# Patient Record
Sex: Female | Born: 1950 | Race: White | Hispanic: No | Marital: Married | Smoking: Never smoker
Health system: Southern US, Community
[De-identification: ages and names within clinical notes are randomized; demographics above are authoritative.]

## PROBLEM LIST (undated history)

## (undated) DIAGNOSIS — M810 Age-related osteoporosis without current pathological fracture: Secondary | ICD-10-CM

## (undated) DIAGNOSIS — R51 Headache: Secondary | ICD-10-CM

## (undated) DIAGNOSIS — M5000 Cervical disc disorder with myelopathy, unspecified cervical region: Secondary | ICD-10-CM

## (undated) DIAGNOSIS — K219 Gastro-esophageal reflux disease without esophagitis: Secondary | ICD-10-CM

## (undated) DIAGNOSIS — R55 Syncope and collapse: Secondary | ICD-10-CM

## (undated) DIAGNOSIS — Z86010 Personal history of colonic polyps: Secondary | ICD-10-CM

## (undated) DIAGNOSIS — M949 Disorder of cartilage, unspecified: Secondary | ICD-10-CM

## (undated) DIAGNOSIS — J45909 Unspecified asthma, uncomplicated: Secondary | ICD-10-CM

## (undated) DIAGNOSIS — R609 Edema, unspecified: Secondary | ICD-10-CM

## (undated) DIAGNOSIS — K5790 Diverticulosis of intestine, part unspecified, without perforation or abscess without bleeding: Secondary | ICD-10-CM

## (undated) DIAGNOSIS — M899 Disorder of bone, unspecified: Secondary | ICD-10-CM

## (undated) DIAGNOSIS — E785 Hyperlipidemia, unspecified: Secondary | ICD-10-CM

## (undated) HISTORY — PX: APPENDECTOMY: SHX54

## (undated) HISTORY — DX: Hyperlipidemia, unspecified: E78.5

## (undated) HISTORY — DX: Disorder of cartilage, unspecified: M94.9

## (undated) HISTORY — PX: OTHER SURGICAL HISTORY: SHX169

## (undated) HISTORY — PX: ABDOMINAL HYSTERECTOMY: SHX81

## (undated) HISTORY — DX: Diverticulosis of intestine, part unspecified, without perforation or abscess without bleeding: K57.90

## (undated) HISTORY — PX: TUBAL LIGATION: SHX77

## (undated) HISTORY — DX: Age-related osteoporosis without current pathological fracture: M81.0

## (undated) HISTORY — DX: Edema, unspecified: R60.9

## (undated) HISTORY — DX: Headache: R51

## (undated) HISTORY — DX: Personal history of colonic polyps: Z86.010

## (undated) HISTORY — DX: Syncope and collapse: R55

## (undated) HISTORY — DX: Disorder of bone, unspecified: M89.9

## (undated) HISTORY — PX: TONSILLECTOMY: SHX5217

## (undated) HISTORY — DX: Unspecified asthma, uncomplicated: J45.909

## (undated) HISTORY — DX: Gastro-esophageal reflux disease without esophagitis: K21.9

## (undated) HISTORY — PX: LUMBAR DISC SURGERY: SHX700

## (undated) HISTORY — DX: Cervical disc disorder with myelopathy, unspecified cervical region: M50.00

## (undated) HISTORY — PX: CHOLECYSTECTOMY: SHX55

---

## 1996-12-08 ENCOUNTER — Encounter: Payer: Self-pay | Admitting: Internal Medicine

## 1996-12-08 LAB — CONVERTED CEMR LAB

## 1999-07-19 ENCOUNTER — Other Ambulatory Visit: Admission: RE | Admit: 1999-07-19 | Discharge: 1999-07-19 | Payer: Self-pay | Admitting: Gynecology

## 1999-11-20 ENCOUNTER — Encounter: Payer: Self-pay | Admitting: Neurosurgery

## 1999-11-20 ENCOUNTER — Observation Stay (HOSPITAL_COMMUNITY): Admission: RE | Admit: 1999-11-20 | Discharge: 1999-11-21 | Payer: Self-pay | Admitting: Neurosurgery

## 1999-12-12 ENCOUNTER — Encounter: Payer: Self-pay | Admitting: Neurosurgery

## 1999-12-12 ENCOUNTER — Ambulatory Visit (HOSPITAL_COMMUNITY): Admission: RE | Admit: 1999-12-12 | Discharge: 1999-12-12 | Payer: Self-pay | Admitting: Neurosurgery

## 2000-03-28 ENCOUNTER — Ambulatory Visit (HOSPITAL_COMMUNITY): Admission: RE | Admit: 2000-03-28 | Discharge: 2000-03-28 | Payer: Self-pay | Admitting: Internal Medicine

## 2000-03-28 ENCOUNTER — Encounter: Payer: Self-pay | Admitting: Internal Medicine

## 2000-06-27 ENCOUNTER — Other Ambulatory Visit: Admission: RE | Admit: 2000-06-27 | Discharge: 2000-06-27 | Payer: Self-pay | Admitting: Gastroenterology

## 2000-06-27 ENCOUNTER — Encounter (INDEPENDENT_AMBULATORY_CARE_PROVIDER_SITE_OTHER): Payer: Self-pay | Admitting: Specialist

## 2003-01-12 ENCOUNTER — Encounter: Payer: Self-pay | Admitting: Internal Medicine

## 2003-01-12 ENCOUNTER — Encounter: Admission: RE | Admit: 2003-01-12 | Discharge: 2003-01-12 | Payer: Self-pay | Admitting: Internal Medicine

## 2003-01-17 ENCOUNTER — Encounter: Payer: Self-pay | Admitting: Internal Medicine

## 2003-01-17 ENCOUNTER — Encounter: Admission: RE | Admit: 2003-01-17 | Discharge: 2003-01-17 | Payer: Self-pay | Admitting: Internal Medicine

## 2004-09-04 ENCOUNTER — Ambulatory Visit: Payer: Self-pay | Admitting: Internal Medicine

## 2004-10-15 ENCOUNTER — Ambulatory Visit: Payer: Self-pay | Admitting: Internal Medicine

## 2005-01-08 ENCOUNTER — Ambulatory Visit: Payer: Self-pay | Admitting: Internal Medicine

## 2005-02-14 ENCOUNTER — Ambulatory Visit: Payer: Self-pay | Admitting: Internal Medicine

## 2005-02-21 ENCOUNTER — Ambulatory Visit: Payer: Self-pay | Admitting: Internal Medicine

## 2005-06-05 ENCOUNTER — Ambulatory Visit: Payer: Self-pay | Admitting: Gastroenterology

## 2005-06-20 ENCOUNTER — Ambulatory Visit: Payer: Self-pay | Admitting: Gastroenterology

## 2005-10-28 ENCOUNTER — Ambulatory Visit: Payer: Self-pay | Admitting: Internal Medicine

## 2006-01-06 ENCOUNTER — Ambulatory Visit: Payer: Self-pay | Admitting: Internal Medicine

## 2006-06-04 ENCOUNTER — Ambulatory Visit: Payer: Self-pay | Admitting: Internal Medicine

## 2006-06-11 ENCOUNTER — Ambulatory Visit: Payer: Self-pay | Admitting: Internal Medicine

## 2006-10-13 ENCOUNTER — Ambulatory Visit: Payer: Self-pay | Admitting: Internal Medicine

## 2006-10-17 ENCOUNTER — Ambulatory Visit: Payer: Self-pay | Admitting: Internal Medicine

## 2006-10-29 ENCOUNTER — Ambulatory Visit: Payer: Self-pay | Admitting: Cardiovascular Disease

## 2007-04-13 ENCOUNTER — Encounter: Payer: Self-pay | Admitting: Internal Medicine

## 2007-04-13 DIAGNOSIS — M949 Disorder of cartilage, unspecified: Secondary | ICD-10-CM

## 2007-04-13 DIAGNOSIS — K219 Gastro-esophageal reflux disease without esophagitis: Secondary | ICD-10-CM

## 2007-04-13 DIAGNOSIS — R32 Unspecified urinary incontinence: Secondary | ICD-10-CM

## 2007-04-13 DIAGNOSIS — M899 Disorder of bone, unspecified: Secondary | ICD-10-CM

## 2007-04-13 DIAGNOSIS — K5730 Diverticulosis of large intestine without perforation or abscess without bleeding: Secondary | ICD-10-CM

## 2007-04-13 DIAGNOSIS — E785 Hyperlipidemia, unspecified: Secondary | ICD-10-CM

## 2007-04-13 DIAGNOSIS — Z8719 Personal history of other diseases of the digestive system: Secondary | ICD-10-CM

## 2007-04-13 DIAGNOSIS — Z87898 Personal history of other specified conditions: Secondary | ICD-10-CM

## 2007-04-13 HISTORY — DX: Hyperlipidemia, unspecified: E78.5

## 2007-04-13 HISTORY — DX: Disorder of bone, unspecified: M89.9

## 2007-04-13 HISTORY — DX: Gastro-esophageal reflux disease without esophagitis: K21.9

## 2007-05-06 DIAGNOSIS — S335XXA Sprain of ligaments of lumbar spine, initial encounter: Secondary | ICD-10-CM

## 2007-05-14 ENCOUNTER — Ambulatory Visit: Payer: Self-pay | Admitting: Internal Medicine

## 2007-05-19 ENCOUNTER — Ambulatory Visit: Payer: Self-pay | Admitting: Internal Medicine

## 2007-05-19 ENCOUNTER — Encounter: Payer: Self-pay | Admitting: Internal Medicine

## 2007-05-19 DIAGNOSIS — M5000 Cervical disc disorder with myelopathy, unspecified cervical region: Secondary | ICD-10-CM | POA: Insufficient documentation

## 2007-05-19 DIAGNOSIS — R42 Dizziness and giddiness: Secondary | ICD-10-CM | POA: Insufficient documentation

## 2007-05-19 DIAGNOSIS — G43909 Migraine, unspecified, not intractable, without status migrainosus: Secondary | ICD-10-CM

## 2007-05-19 DIAGNOSIS — M542 Cervicalgia: Secondary | ICD-10-CM

## 2007-05-19 DIAGNOSIS — G8929 Other chronic pain: Secondary | ICD-10-CM | POA: Insufficient documentation

## 2007-05-19 HISTORY — DX: Cervical disc disorder with myelopathy, unspecified cervical region: M50.00

## 2007-05-23 ENCOUNTER — Encounter: Payer: Self-pay | Admitting: Internal Medicine

## 2007-05-23 DIAGNOSIS — M810 Age-related osteoporosis without current pathological fracture: Secondary | ICD-10-CM

## 2007-05-23 HISTORY — DX: Age-related osteoporosis without current pathological fracture: M81.0

## 2007-05-27 ENCOUNTER — Ambulatory Visit: Payer: Self-pay | Admitting: Internal Medicine

## 2007-05-27 LAB — CONVERTED CEMR LAB
BUN: 12 mg/dL (ref 6–23)
Creatinine, Ser: 0.9 mg/dL (ref 0.4–1.2)
Potassium: 4.4 meq/L (ref 3.5–5.1)
Total CK: 57 units/L (ref 7–177)

## 2007-05-31 ENCOUNTER — Encounter: Admission: RE | Admit: 2007-05-31 | Discharge: 2007-05-31 | Payer: Self-pay | Admitting: Internal Medicine

## 2007-06-02 ENCOUNTER — Ambulatory Visit: Payer: Self-pay | Admitting: Internal Medicine

## 2007-06-10 ENCOUNTER — Ambulatory Visit: Payer: Self-pay | Admitting: Internal Medicine

## 2007-06-10 LAB — CONVERTED CEMR LAB
ALT: 43 units/L — ABNORMAL HIGH (ref 0–35)
AST: 38 units/L — ABNORMAL HIGH (ref 0–37)
Albumin: 4 g/dL (ref 3.5–5.2)
Basophils Absolute: 0 10*3/uL (ref 0.0–0.1)
Basophils Relative: 0.2 % (ref 0.0–1.0)
Bilirubin, Direct: 0.2 mg/dL (ref 0.0–0.3)
Cholesterol: 231 mg/dL (ref 0–200)
Eosinophils Relative: 0.1 % (ref 0.0–5.0)
GFR calc non Af Amer: 79 mL/min
HCT: 41.7 % (ref 36.0–46.0)
Hemoglobin: 14.2 g/dL (ref 12.0–15.0)
Leukocytes, UA: NEGATIVE
MCHC: 34.1 g/dL (ref 30.0–36.0)
Neutro Abs: 7 10*3/uL (ref 1.4–7.7)
Platelets: 493 10*3/uL — ABNORMAL HIGH (ref 150–400)
RBC: 4.48 M/uL (ref 3.87–5.11)
RDW: 11.3 % — ABNORMAL LOW (ref 11.5–14.6)
Sodium: 141 meq/L (ref 135–145)
Specific Gravity, Urine: >=1.03 (ref 1.000–1.03)
TSH: 0.54 microintl units/mL (ref 0.35–5.50)
Total Bilirubin: 0.9 mg/dL (ref 0.3–1.2)
Total CHOL/HDL Ratio: 4
Triglycerides: 124 mg/dL (ref 0–149)
Urine Glucose: NEGATIVE mg/dL
VLDL: 25 mg/dL (ref 0–40)
WBC: 8.5 10*3/uL (ref 4.5–10.5)

## 2007-06-17 ENCOUNTER — Encounter: Payer: Self-pay | Admitting: Internal Medicine

## 2007-06-17 ENCOUNTER — Ambulatory Visit: Payer: Self-pay | Admitting: Internal Medicine

## 2007-06-17 DIAGNOSIS — N760 Acute vaginitis: Secondary | ICD-10-CM | POA: Insufficient documentation

## 2007-07-10 ENCOUNTER — Ambulatory Visit: Payer: Self-pay | Admitting: Internal Medicine

## 2007-07-10 DIAGNOSIS — R109 Unspecified abdominal pain: Secondary | ICD-10-CM | POA: Insufficient documentation

## 2007-07-20 ENCOUNTER — Telehealth: Payer: Self-pay | Admitting: Internal Medicine

## 2007-07-21 ENCOUNTER — Ambulatory Visit: Payer: Self-pay | Admitting: Gastroenterology

## 2007-07-21 ENCOUNTER — Encounter: Payer: Self-pay | Admitting: Internal Medicine

## 2007-07-28 ENCOUNTER — Ambulatory Visit (HOSPITAL_COMMUNITY): Admission: RE | Admit: 2007-07-28 | Discharge: 2007-07-28 | Payer: Self-pay | Admitting: Gastroenterology

## 2007-07-28 ENCOUNTER — Encounter: Payer: Self-pay | Admitting: Gastroenterology

## 2007-07-30 ENCOUNTER — Ambulatory Visit: Payer: Self-pay | Admitting: Gastroenterology

## 2007-08-05 ENCOUNTER — Ambulatory Visit: Payer: Self-pay | Admitting: Internal Medicine

## 2007-08-05 DIAGNOSIS — R748 Abnormal levels of other serum enzymes: Secondary | ICD-10-CM | POA: Insufficient documentation

## 2007-08-05 LAB — CONVERTED CEMR LAB
ALT: 25 units/L (ref 0–35)
AST: 23 units/L (ref 0–37)
Alkaline Phosphatase: 61 units/L (ref 39–117)
Basophils Absolute: 0.3 10*3/uL — ABNORMAL HIGH (ref 0.0–0.1)
Chloride: 102 meq/L (ref 96–112)
Eosinophils Relative: 5.8 % — ABNORMAL HIGH (ref 0.0–5.0)
GFR calc Af Amer: 95 mL/min
HCT: 41.5 % (ref 36.0–46.0)
Hgb A1c MFr Bld: 5.6 % (ref 4.6–6.0)
Lymphocytes Relative: 28.7 % (ref 12.0–46.0)
MCHC: 34.3 g/dL (ref 30.0–36.0)
Monocytes Absolute: 0.5 10*3/uL (ref 0.2–0.7)
Platelets: 410 10*3/uL — ABNORMAL HIGH (ref 150–400)
Potassium: 4.7 meq/L (ref 3.5–5.1)
RBC: 4.48 M/uL (ref 3.87–5.11)
Total Bilirubin: 0.7 mg/dL (ref 0.3–1.2)
Total Protein, Urine: NEGATIVE mg/dL
Total Protein: 7.2 g/dL (ref 6.0–8.3)
Urine Glucose: NEGATIVE mg/dL
WBC: 7.5 10*3/uL (ref 4.5–10.5)

## 2007-08-12 ENCOUNTER — Ambulatory Visit: Payer: Self-pay | Admitting: Internal Medicine

## 2007-08-12 DIAGNOSIS — J45909 Unspecified asthma, uncomplicated: Secondary | ICD-10-CM

## 2007-08-12 HISTORY — DX: Unspecified asthma, uncomplicated: J45.909

## 2007-09-25 ENCOUNTER — Ambulatory Visit: Payer: Self-pay | Admitting: Gastroenterology

## 2007-10-22 ENCOUNTER — Encounter: Payer: Self-pay | Admitting: Internal Medicine

## 2007-11-02 ENCOUNTER — Ambulatory Visit: Payer: Self-pay | Admitting: Internal Medicine

## 2007-11-02 DIAGNOSIS — M7710 Lateral epicondylitis, unspecified elbow: Secondary | ICD-10-CM | POA: Insufficient documentation

## 2008-01-27 ENCOUNTER — Ambulatory Visit: Payer: Self-pay | Admitting: Internal Medicine

## 2008-01-27 DIAGNOSIS — L259 Unspecified contact dermatitis, unspecified cause: Secondary | ICD-10-CM

## 2008-04-19 ENCOUNTER — Ambulatory Visit: Payer: Self-pay | Admitting: Endocrinology

## 2008-05-04 ENCOUNTER — Telehealth: Payer: Self-pay | Admitting: Internal Medicine

## 2008-05-09 ENCOUNTER — Telehealth (INDEPENDENT_AMBULATORY_CARE_PROVIDER_SITE_OTHER): Payer: Self-pay | Admitting: *Deleted

## 2008-05-09 ENCOUNTER — Ambulatory Visit: Payer: Self-pay | Admitting: Gastroenterology

## 2008-05-09 ENCOUNTER — Telehealth: Payer: Self-pay | Admitting: Gastroenterology

## 2008-05-09 DIAGNOSIS — K648 Other hemorrhoids: Secondary | ICD-10-CM | POA: Insufficient documentation

## 2008-05-09 DIAGNOSIS — K644 Residual hemorrhoidal skin tags: Secondary | ICD-10-CM | POA: Insufficient documentation

## 2008-07-06 ENCOUNTER — Ambulatory Visit: Payer: Self-pay | Admitting: Internal Medicine

## 2008-07-06 LAB — CONVERTED CEMR LAB
ALT: 27 units/L (ref 0–35)
Alkaline Phosphatase: 60 units/L (ref 39–117)
Basophils Relative: 0.4 % (ref 0.0–3.0)
Bilirubin, Direct: 0.2 mg/dL (ref 0.0–0.3)
CO2: 29 meq/L (ref 19–32)
Calcium: 9.8 mg/dL (ref 8.4–10.5)
Eosinophils Absolute: 0.3 10*3/uL (ref 0.0–0.7)
Eosinophils Relative: 4.5 % (ref 0.0–5.0)
HDL: 54.5 mg/dL (ref >39.0)
Hemoglobin, Urine: NEGATIVE
Ketones, ur: NEGATIVE mg/dL
LDL Cholesterol: 116 mg/dL — ABNORMAL HIGH (ref 0–99)
Leukocytes, UA: NEGATIVE
Lymphocytes Relative: 35.5 % (ref 12.0–46.0)
MCHC: 34.8 g/dL (ref 30.0–36.0)
Monocytes Relative: 9.3 % (ref 3.0–12.0)
TSH: 1.45 microintl units/mL (ref 0.35–5.50)
Total CHOL/HDL Ratio: 3.6
Total Protein, Urine: NEGATIVE mg/dL
Total Protein: 6.7 g/dL (ref 6.0–8.3)
Urine Glucose: NEGATIVE mg/dL
Urobilinogen, UA: 0.2 (ref 0.0–1.0)
VLDL: 27 mg/dL (ref 0–40)
WBC: 6.4 10*3/uL (ref 4.5–10.5)
pH: 5.5 (ref 5.0–8.0)

## 2008-07-07 ENCOUNTER — Encounter: Payer: Self-pay | Admitting: Internal Medicine

## 2008-07-14 ENCOUNTER — Ambulatory Visit: Payer: Self-pay | Admitting: Internal Medicine

## 2008-07-14 DIAGNOSIS — M25569 Pain in unspecified knee: Secondary | ICD-10-CM

## 2008-07-14 DIAGNOSIS — M25529 Pain in unspecified elbow: Secondary | ICD-10-CM | POA: Insufficient documentation

## 2008-07-15 ENCOUNTER — Telehealth (INDEPENDENT_AMBULATORY_CARE_PROVIDER_SITE_OTHER): Payer: Self-pay | Admitting: *Deleted

## 2008-07-21 ENCOUNTER — Encounter: Admission: RE | Admit: 2008-07-21 | Discharge: 2008-07-21 | Payer: Self-pay | Admitting: Internal Medicine

## 2008-10-04 ENCOUNTER — Encounter: Payer: Self-pay | Admitting: Internal Medicine

## 2008-10-07 ENCOUNTER — Encounter: Payer: Self-pay | Admitting: Internal Medicine

## 2008-10-28 ENCOUNTER — Encounter: Payer: Self-pay | Admitting: Internal Medicine

## 2008-11-10 ENCOUNTER — Ambulatory Visit: Payer: Self-pay | Admitting: Internal Medicine

## 2009-02-07 ENCOUNTER — Ambulatory Visit: Payer: Self-pay | Admitting: Internal Medicine

## 2009-02-07 ENCOUNTER — Encounter (INDEPENDENT_AMBULATORY_CARE_PROVIDER_SITE_OTHER): Payer: Self-pay | Admitting: *Deleted

## 2009-02-07 DIAGNOSIS — S93409A Sprain of unspecified ligament of unspecified ankle, initial encounter: Secondary | ICD-10-CM | POA: Insufficient documentation

## 2009-02-07 DIAGNOSIS — J019 Acute sinusitis, unspecified: Secondary | ICD-10-CM

## 2009-03-10 ENCOUNTER — Ambulatory Visit: Payer: Self-pay | Admitting: Internal Medicine

## 2009-03-10 DIAGNOSIS — M79609 Pain in unspecified limb: Secondary | ICD-10-CM

## 2009-03-10 DIAGNOSIS — R609 Edema, unspecified: Secondary | ICD-10-CM

## 2009-03-10 HISTORY — DX: Edema, unspecified: R60.9

## 2009-03-27 ENCOUNTER — Ambulatory Visit: Payer: Self-pay | Admitting: Internal Medicine

## 2009-03-27 LAB — CONVERTED CEMR LAB
Calcium: 9.1 mg/dL (ref 8.4–10.5)
Creatinine, Ser: 0.9 mg/dL (ref 0.4–1.2)
TSH: 1.24 microintl units/mL (ref 0.35–5.50)

## 2009-03-30 ENCOUNTER — Telehealth: Payer: Self-pay | Admitting: Internal Medicine

## 2009-03-31 ENCOUNTER — Ambulatory Visit: Payer: Self-pay | Admitting: Internal Medicine

## 2009-04-03 ENCOUNTER — Encounter: Payer: Self-pay | Admitting: Internal Medicine

## 2009-04-03 ENCOUNTER — Ambulatory Visit: Payer: Self-pay

## 2009-06-28 ENCOUNTER — Telehealth: Payer: Self-pay | Admitting: Internal Medicine

## 2009-08-02 ENCOUNTER — Ambulatory Visit: Payer: Self-pay | Admitting: Internal Medicine

## 2009-08-02 LAB — CONVERTED CEMR LAB
AST: 23 units/L (ref 0–37)
Albumin: 3.9 g/dL (ref 3.5–5.2)
BUN: 17 mg/dL (ref 6–23)
Basophils Absolute: 0.1 10*3/uL (ref 0.0–0.1)
Basophils Relative: 0.9 % (ref 0.0–3.0)
Bilirubin Urine: NEGATIVE
CO2: 24 meq/L (ref 19–32)
Direct LDL: 147.7 mg/dL
Eosinophils Absolute: 0.1 10*3/uL (ref 0.0–0.7)
Eosinophils Relative: 1.4 % (ref 0.0–5.0)
GFR calc non Af Amer: 77.78 mL/min (ref >60)
HCT: 41.3 % (ref 36.0–46.0)
HDL: 59.8 mg/dL (ref >39.00)
Ketones, ur: NEGATIVE mg/dL
Leukocytes, UA: NEGATIVE
Lymphocytes Relative: 23.4 % (ref 12.0–46.0)
Lymphs Abs: 2.2 10*3/uL (ref 0.7–4.0)
MCHC: 33.6 g/dL (ref 30.0–36.0)
MCV: 96.7 fL (ref 78.0–100.0)
Monocytes Absolute: 0.7 10*3/uL (ref 0.1–1.0)
Monocytes Relative: 7.7 % (ref 3.0–12.0)
Nitrite: NEGATIVE
RDW: 12 % (ref 11.5–14.6)
Sodium: 146 meq/L — ABNORMAL HIGH (ref 135–145)
Specific Gravity, Urine: >=1.03 (ref 1.000–1.030)
Total CHOL/HDL Ratio: 4
Triglycerides: 122 mg/dL (ref 0.0–149.0)
WBC: 9.2 10*3/uL (ref 4.5–10.5)

## 2009-08-10 ENCOUNTER — Ambulatory Visit: Payer: Self-pay | Admitting: Internal Medicine

## 2009-09-05 ENCOUNTER — Telehealth: Payer: Self-pay | Admitting: Internal Medicine

## 2009-09-12 ENCOUNTER — Telehealth (INDEPENDENT_AMBULATORY_CARE_PROVIDER_SITE_OTHER): Payer: Self-pay | Admitting: *Deleted

## 2010-02-06 ENCOUNTER — Ambulatory Visit: Payer: Self-pay | Admitting: Internal Medicine

## 2010-02-06 LAB — CONVERTED CEMR LAB
Basophils Absolute: 0.1 10*3/uL (ref 0.0–0.1)
Eosinophils Relative: 3.5 % (ref 0.0–5.0)
HCT: 38.2 % (ref 36.0–46.0)
Lymphs Abs: 2.7 10*3/uL (ref 0.7–4.0)
MCHC: 34.3 g/dL (ref 30.0–36.0)
MCV: 92.8 fL (ref 78.0–100.0)
Monocytes Absolute: 0.4 10*3/uL (ref 0.1–1.0)
Neutrophils Relative %: 51.3 % (ref 43.0–77.0)

## 2010-02-15 ENCOUNTER — Ambulatory Visit: Payer: Self-pay | Admitting: Internal Medicine

## 2010-02-15 DIAGNOSIS — R55 Syncope and collapse: Secondary | ICD-10-CM

## 2010-02-15 DIAGNOSIS — R51 Headache: Secondary | ICD-10-CM

## 2010-02-15 DIAGNOSIS — R519 Headache, unspecified: Secondary | ICD-10-CM | POA: Insufficient documentation

## 2010-02-15 HISTORY — DX: Syncope and collapse: R55

## 2010-02-15 HISTORY — DX: Headache: R51

## 2010-02-15 LAB — CONVERTED CEMR LAB
AST: 22 units/L (ref 0–37)
BUN: 15 mg/dL (ref 6–23)
Bilirubin, Direct: 0.1 mg/dL (ref 0.0–0.3)
CO2: 28 meq/L (ref 19–32)
Chloride: 104 meq/L (ref 96–112)
Creatinine, Ser: 0.7 mg/dL (ref 0.4–1.2)
Sodium: 140 meq/L (ref 135–145)
TSH: 2.22 microintl units/mL (ref 0.35–5.50)

## 2010-02-17 ENCOUNTER — Ambulatory Visit (HOSPITAL_COMMUNITY): Admission: RE | Admit: 2010-02-17 | Discharge: 2010-02-17 | Payer: Self-pay | Admitting: Internal Medicine

## 2010-02-20 ENCOUNTER — Telehealth: Payer: Self-pay | Admitting: Internal Medicine

## 2010-02-27 ENCOUNTER — Telehealth: Payer: Self-pay | Admitting: Internal Medicine

## 2010-02-27 DIAGNOSIS — G43019 Migraine without aura, intractable, without status migrainosus: Secondary | ICD-10-CM | POA: Insufficient documentation

## 2010-03-16 ENCOUNTER — Telehealth: Payer: Self-pay | Admitting: Internal Medicine

## 2010-05-10 ENCOUNTER — Encounter (INDEPENDENT_AMBULATORY_CARE_PROVIDER_SITE_OTHER): Payer: Self-pay | Admitting: *Deleted

## 2010-06-29 ENCOUNTER — Encounter (INDEPENDENT_AMBULATORY_CARE_PROVIDER_SITE_OTHER): Payer: Self-pay | Admitting: *Deleted

## 2010-07-20 ENCOUNTER — Encounter (INDEPENDENT_AMBULATORY_CARE_PROVIDER_SITE_OTHER): Payer: Self-pay | Admitting: *Deleted

## 2010-07-24 ENCOUNTER — Ambulatory Visit: Payer: Self-pay | Admitting: Gastroenterology

## 2010-08-14 ENCOUNTER — Ambulatory Visit: Payer: Self-pay | Admitting: Gastroenterology

## 2010-08-14 DIAGNOSIS — Z860100 Personal history of colon polyps, unspecified: Secondary | ICD-10-CM

## 2010-08-14 DIAGNOSIS — K5790 Diverticulosis of intestine, part unspecified, without perforation or abscess without bleeding: Secondary | ICD-10-CM

## 2010-08-14 HISTORY — DX: Personal history of colon polyps, unspecified: Z86.0100

## 2010-08-14 HISTORY — DX: Personal history of colonic polyps: Z86.010

## 2010-08-14 HISTORY — DX: Diverticulosis of intestine, part unspecified, without perforation or abscess without bleeding: K57.90

## 2010-08-14 LAB — HM COLONOSCOPY

## 2010-08-15 ENCOUNTER — Encounter: Payer: Self-pay | Admitting: Gastroenterology

## 2010-10-07 LAB — CONVERTED CEMR LAB
Albumin: 4.1 g/dL (ref 3.5–5.2)
Basophils Relative: 0.5 % (ref 0.0–3.0)
Bilirubin Urine: NEGATIVE
Bilirubin, Direct: 0.3 mg/dL (ref 0.0–0.3)
CO2: 32 meq/L (ref 19–32)
Calcium: 8.9 mg/dL (ref 8.4–10.5)
Eosinophils Absolute: 0.2 10*3/uL (ref 0.0–0.7)
Eosinophils Relative: 2.8 % (ref 0.0–5.0)
HCT: 42.8 % (ref 36.0–46.0)
Hemoglobin: 14.5 g/dL (ref 12.0–15.0)
Lymphocytes Relative: 38.9 % (ref 12.0–46.0)
MCHC: 33.8 g/dL (ref 30.0–36.0)
MCV: 93.5 fL (ref 78.0–100.0)
Neutrophils Relative %: 48.9 % (ref 43.0–77.0)
Platelets: 411 10*3/uL — ABNORMAL HIGH (ref 150.0–400.0)
RDW: 12.8 % (ref 11.5–14.6)
Sodium: 138 meq/L (ref 135–145)
Total Bilirubin: 0.9 mg/dL (ref 0.3–1.2)
Total Protein: 7.4 g/dL (ref 6.0–8.3)
WBC: 8.2 10*3/uL (ref 4.5–10.5)

## 2010-10-09 NOTE — Letter (Signed)
Summary: [facility] Instructions  [facility]  [address] [place], [place] [address]   Phone: [phone]  Fax: [phone]       Sheila Hall    [birth year]    MRN: [id number]        Procedure Day /Date: Tuesday 08-14-10     Arrival Time: 7:30 a.m.     Procedure Time: 8:30 a.m.     Location of Procedure:                    _x _  [facility] (4th Floor)   PREPARATION FOR COLONOSCOPY WITH MOVIPREP   Starting 5 days prior to your procedure  08-09-10  do not eat nuts, seeds, popcorn, corn, beans, peas,  salads, or any raw vegetables.  Do not take any fiber supplements (e.g. Metamucil, Citrucel, and Benefiber).  THE DAY BEFORE YOUR PROCEDURE         DATE:  08-13-10   DAY:  Monday   1.  Drink clear liquids the entire day-NO SOLID FOOD  2.  Do not drink anything colored red or purple.  Avoid juices with pulp.  No orange juice.  3.  Drink at least 64 oz. (8 glasses) of fluid/clear liquids during the day to prevent dehydration and help the prep work efficiently.  CLEAR LIQUIDS INCLUDE: Water Jello Ice Popsicles Tea (sugar ok, no milk/cream) Powdered fruit flavored drinks Coffee (sugar ok, no milk/cream) Gatorade Juice: apple, white grape, white cranberry  Lemonade Clear bullion, consomm, broth Carbonated beverages (any kind) Strained chicken noodle soup Hard Candy                             4.  In the morning, mix first dose of MoviPrep solution:    Empty 1 Pouch A and 1 Pouch B into the disposable container    Add lukewarm drinking water to the top line of the container. Mix to dissolve    Refrigerate (mixed solution should be used within 24 hrs)  5.  Begin drinking the prep at 5:00 p.m. The MoviPrep container is divided by 4 marks.   Every 15 minutes drink the solution down to the next mark (approximately 8 oz) until the full liter is complete.   6.  Follow completed prep with 16 oz of clear liquid of your choice (Nothing red or  purple).  Continue to drink clear liquids until bedtime.  7.  Before going to bed, mix second dose of MoviPrep solution:    Empty 1 Pouch A and 1 Pouch B into the disposable container    Add lukewarm drinking water to the top line of the container. Mix to dissolve    Refrigerate  THE DAY OF YOUR PROCEDURE      DATE:  08-14-10  DAY:  Tuesday  Beginning at  3:30 a.m. (5 hours before procedure):         1. Every 15 minutes, drink the solution down to the next mark (approx 8 oz) until the full liter is complete.  2. Follow completed prep with 16 oz. of clear liquid of your choice.    3. You may drink clear liquids until  6:30 a.m.  (2 HOURS BEFORE PROCEDURE).   MEDICATION INSTRUCTIONS  Unless otherwise instructed, you should take regular prescription medications with a small sip of water   as early as possible the morning of your procedure.  OTHER INSTRUCTIONS  You will need a responsible adult at least 62 years of age to accompany you and drive you home.   This person must remain in the waiting room during your procedure.  Wear loose fitting clothing that is easily removed.  Leave jewelry and other valuables at home.  However, you may wish to bring a book to read or  an iPod/MP3 player to listen to music as you wait for your procedure to start.  Remove all body piercing jewelry and leave at home.  Total time from sign-in until discharge is approximately 2-3 hours.  You should go home directly after your procedure and rest.  You can resume normal activities the  day after your procedure.  The day of your procedure you should not:   Drive   Make legal decisions   Operate machinery   Drink alcohol   Return to work  You will receive specific instructions about eating, activities and medications before you leave.    The above instructions have been reviewed and explained to me by   Ezra Sites RN  July 24, 2010 4:29 PM     I fully understand  and can verbalize these instructions _____________________________ Date _________

## 2010-10-09 NOTE — Letter (Signed)
Summary: Patient Notice- Polyp Results  Sheila Hall  [address] [place], [place] [address]   Phone: [phone]  Fax: [phone]        August 15, 2010 MRN: [id number]    [facility] [address] [place], [place]  [address]    Dear Sheila Hall,  I am pleased to inform you that the colon polyp(s) removed during your recent colonoscopy was (were) found to be benign (no cancer detected) upon pathologic examination.  I recommend you have a repeat colonoscopy examination in 5 years to look for recurrent polyps, as having colon polyps increases your risk for having recurrent polyps or even colon cancer in the future.  Should you develop new or worsening symptoms of abdominal pain, bowel habit changes or bleeding from the rectum or bowels, please schedule an evaluation with either your primary care physician or with me.  Continue treatment plan as outlined the day of your exam.  Please call us if you are having persistent problems or have questions about your condition that have not been fully answered at this time.  Sincerely,  Meryl Dare MD [facility]  This letter has been electronically signed by your physician.  Appended Document: Patient Notice- Polyp Results Letter mailed

## 2010-10-09 NOTE — Progress Notes (Signed)
Summary: Sumavel PA  Phone Note From Pharmacy   [facility]  [address] [phone]* Summary of Call: PA request--Sumavel 6mg /0.42ml. Has this patient tried and failed generic migraine options? Proceed with prior authorization? Please advise. Initial call taken by: Lucious Groves,  February 20, 2010 4:42 PM  Follow-up for Phone Call        yes she failed gen Imitrex Thx Follow-up by: Tresa Garter MD,  February 20, 2010 5:32 PM  Additional Follow-up for Phone Call Additional follow up Details #1::        Pt aware that PA is in process, she has failed imitrex.......................Marland KitchenLamar Sprinkles, CMA  February 21, 2010 11:40 AM     Additional Follow-up for Phone Call Additional follow up Details #2::    PA completed via [email or web address] and requires further review by [facility]. Will await insurance company reply.  Follow-up by: Lucious Groves,  February 22, 2010 11:44 AM

## 2010-10-09 NOTE — Letter (Signed)
Summary: Colonoscopy Letter  [facility]  [address] [place], [place] [address]   Phone: [phone]  Fax: [phone]      May 10, 2010 MRN: [id number]   [facility] [address] [place], [place]  [address]   Dear Ms. [facility],   According to your medical record, it is time for you to schedule a Colonoscopy. The American Cancer Society recommends this procedure as a method to detect early colon cancer. Patients with a family history of colon cancer, or a personal history of colon polyps or inflammatory bowel disease are at increased risk.  This letter has beeen generated based on the recommendations made at the time of your procedure. If you feel that in your particular situation this may no longer apply, please contact our office.  Please call our office at [phone] to schedule this appointment or to update your records at your earliest convenience.  Thank you for cooperating with [place] to provide you with the very best care possible.   Sincerely,  Judie Petit T. Russella Dar, M.D.  [facility] [phone]

## 2010-10-09 NOTE — Assessment & Plan Note (Signed)
Summary: 6 MO ROV /NWS #   Vital Signs:  Patient profile:   60 year old female Height:      68 inches Weight:      186.75 pounds BMI:     28.50 O2 Sat:      98 % on Room air Temp:     98.4 degrees F oral Pulse rate:   70 / minute BP sitting:   120 / 74  (left arm) Cuff size:   large  Vitals Entered By: Lucious Groves (February 15, 2010 8:06 AM)  O2 Flow:  Room air CC: 6 mo rtn ov./kb Is Patient Diabetic? No Pain Assessment Patient in pain? no      Comments Patient notes that she needs prescription for 50mg  Hydroxyzine and is not taking Pennsaid./kb   Primary Care Provider:  Sonda Primes  CC:  6 mo rtn ov./kb.  History of Present Illness: C/o R temple sharp severe HAs irrad. to forehead;  varies from  2-9 - bad; occasional nausea: she threw up and passed out in the bathroom om Mon night. BP 135/85. HR 90. There was no CP or SOB. her husband thought she had a stroke... She is OK now w/mild HA.  Current Medications (verified): 1)  Estrace 1 Mg  Tabs (Estradiol) .... 1/2 Qd 2)  Allegra 180 Mg  Tabs (Fexofenadine Hcl) .... As Needed 3)  Tramadol Hcl 50 Mg  Tabs (Tramadol Hcl) .Marland Kitchen.. 1or2 Two Times A Day  Prn 4)  Proair Hfa 108 (90 Base) Mcg/act  Aers (Albuterol Sulfate) .... 2 Inh Q4h As Needed Shortness of Breath 5)  Hydroxyzine Hcl 25 Mg Tabs (Hydroxyzine Hcl) .Marland Kitchen.. 1 By Mouth At Bedtime As Needed 6)  Vitamin D3 1000 Unit  Tabs (Cholecalciferol) .Marland Kitchen.. 1 By Mouth Qd 7)  Triamcinolone Acetonide 0.025 % Crea (Triamcinolone Acetonide) .... Apply Bid To Affected Area 8)  Anamantle Hc 3-2.5 % Kit (Lidocaine-Hydrocortisone Ace) .... Use Gel Three Times A Day X 10 Days and Then Prn 9)  Promethazine Hcl 25 Mg Supp (Promethazine Hcl) .Marland Kitchen.. 1-2 Pr Qid Prn 10)  Promethazine Hcl 25 Mg  Tabs (Promethazine Hcl) .Marland Kitchen.. 1 By Mouth Qid As Needed Nausea 11)  Ditropan Xl 10 Mg Xr24h-Tab (Oxybutynin Chloride) .Marland Kitchen.. 1 By Mouth Qd 12)  Lasix 40 Mg Tabs (Furosemide) .Marland Kitchen.. 1 By Mouth Once Daily As Needed  Swelling 13)  Potassium Chloride Cr 10 Meq  Tbcr (Potassium Chloride) .Marland Kitchen.. 1 By Mouth Once Daily With Furosemide 14)  Zolpidem Tartrate 10 Mg  Tabs (Zolpidem Tartrate) .... 1/2 or 1 By Mouth At [facility] Prn 15)  Aspir-Low 81 Mg Tbec (Aspirin) .Marland Kitchen.. 1 By Mouth Once Daily Pc 16)  Pennsaid 1.5 % Soln (Diclofenac Sodium) .... 3-5 Gtt On Skin Three Times A Day For Pain 17)  Omeprazole 40 Mg Cpdr (Omeprazole) .Marland Kitchen.. 1 By Mouth Qam For Indigestion  Allergies (verified): 1)  ! Codeine Sulfate (Codeine Sulfate)  Past History:  Past Surgical History: Last updated: 05/09/2008 Cervical laminectomy Hysterectomy Appendectomy Cholecystectomy Bilateral tubal ligation  Family History: Last updated: 05/09/2008 Family History of CAD Female 1st degree relative <60 Family History of CAD Female 1st degree relative <50 Family History of Colon Cancer:Father at age 12  Social History: Last updated: 05/09/2008 Married Children- 2 boys Never Smoked Occupation: Designer, fashion/clothing, Ambulance person  Past Medical History: Diverticulitis, hx of Diverticulosis, colon Hyperlipidemia Osteoporosis Urinary incontinence GERD Family Hx colon CA Arthritis Asthma Migraines  Review of Systems  The patient complains of syncope.  The patient denies fever, chest pain, dyspnea on exertion, abdominal pain, melena, and hematochezia.    Physical Exam  General:  alert, well-developed, well-nourished, well-hydrated, cooperative to examination, good hygiene, and overweight-appearing.   Eyes:  vision grossly intact and pupils equal.   Ears:  External ear exam shows no significant lesions or deformities.  Otoscopic examination reveals clear canals, tympanic membranes are intact bilaterally without bulging, retraction, inflammation or discharge. Hearing is grossly normal bilaterally. Nose:  External nasal examination shows no deformity or inflammation. Nasal mucosa are pink and moist without lesions or exudates. Mouth:  Oral  mucosa and oropharynx without lesions or exudates.  Teeth in good repair. Neck:  No deformities, masses, or tenderness noted. Lungs:  Normal respiratory effort, chest expands symmetrically. Lungs are clear to auscultation, no crackles or wheezes. Heart:  Normal rate and regular rhythm. S1 and S2 normal without gallop, murmur, click, rub or other extra sounds. Abdomen:  Bowel sounds positive,abdomen soft and non-tender without masses, organomegaly or hernias noted. Msk:  No deformity or scoliosis noted of thoracic or lumbar spine.  L akle w/lat swelling Neurologic:  No cranial nerve deficits noted. Station and gait are normal. Plantar reflexes are down-going bilaterally. DTRs are symmetrical throughout. Sensory, motor and coordinative functions appear intact. o pronator drift, Romberg neg Skin:  Intact without suspicious lesions or rashes Cervical Nodes:  No lymphadenopathy noted Inguinal Nodes:  No significant adenopathy Psych:  Cognition and judgment appear intact. Alert and cooperative with normal attention span and concentration. No apparent delusions, illusions, hallucinations   Impression & Recommendations:  Problem # 1:  HEADACHE (ICD-784.0) R temple and to L constant x 2 wks Assessment New  Her updated medication list for this problem includes:    Tramadol Hcl 50 Mg Tabs (Tramadol hcl) .Marland Kitchen... 1or2 two times a day  prn    Aspir-low 81 Mg Tbec (Aspirin) .Marland Kitchen... 1 by mouth once daily pc    Sumavel Dosepro 6 Mg/0.44ml Devi (Sumatriptan succinate) ..... Use once daily as needed migraine as dirr.    Relpax 40 Mg Tabs (Eletriptan hydrobromide) .Marland Kitchen... 1 by mouth once daily prn  Orders: TLB-BMP (Basic Metabolic Panel-BMET) (80048-METABOL) TLB-Hepatic/Liver Function Pnl (80076-HEPATIC) TLB-B12, Serum-Total ONLY ([address]-U04) TLB-TSH (Thyroid Stimulating Hormone) (84443-TSH) TLB-Sedimentation Rate (ESR) (85652-ESR) Radiology Referral (Radiology) MRI  Problem # 2:  SYNCOPE (ICD-780.2) most likely  vaso-vagal. Need to r/o brain aneurism etc Assessment: New As per #1 Orders: Radiology Referral (Radiology) TLB-BMP (Basic Metabolic Panel-BMET) (80048-METABOL) TLB-Hepatic/Liver Function Pnl (80076-HEPATIC) TLB-B12, Serum-Total ONLY (54098-J19) TLB-TSH (Thyroid Stimulating Hormone) (84443-TSH) TLB-Sedimentation Rate (ESR) (85652-ESR)  Problem # 3:  MIGRAINE NOS W/O INTRACTABLE MIGRAINE (ICD-346.90) Assessment: Comment Only  Her updated medication list for this problem includes:    Tramadol Hcl 50 Mg Tabs (Tramadol hcl) .Marland Kitchen... 1or2 two times a day  prn    Aspir-low 81 Mg Tbec (Aspirin) .Marland Kitchen... 1 by mouth once daily pc    Sumavel Dosepro 6 Mg/0.26ml Devi (Sumatriptan succinate) ..... Use once daily as needed migraine as dirr.    Relpax 40 Mg Tabs (Eletriptan hydrobromide) .Marland Kitchen... 1 by mouth once daily prn  Complete Medication List: 1)  Estrace 1 Mg Tabs (Estradiol) .... 1/2 qd 2)  Allegra 180 Mg Tabs (Fexofenadine hcl) .... As needed 3)  Tramadol Hcl 50 Mg Tabs (Tramadol hcl) .Marland Kitchen.. 1or2 two times a day  prn 4)  Proair Hfa 108 (90 Base) Mcg/act Aers (Albuterol sulfate) .... 2 inh q4h as needed shortness of breath 5)  Vitamin D3 1000 Unit Tabs (Cholecalciferol) .Marland Kitchen.. 1 by mouth qd 6)  Triamcinolone Acetonide 0.025 % Crea (Triamcinolone acetonide) .... Apply bid to affected area 7)  Anamantle Hc 3-2.5 % Kit (Lidocaine-hydrocortisone ace) .... Use gel three times a day x 10 days and then prn 8)  Promethazine Hcl 25 Mg Supp (Promethazine hcl) .Marland Kitchen.. 1-2 pr qid prn 9)  Promethazine Hcl 25 Mg Tabs (Promethazine hcl) .Marland Kitchen.. 1 by mouth qid as needed nausea 10)  Ditropan Xl 10 Mg Xr24h-tab (Oxybutynin chloride) .Marland Kitchen.. 1 by mouth qd 11)  Lasix 40 Mg Tabs (Furosemide) .Marland Kitchen.. 1 by mouth once daily as needed swelling 12)  Potassium Chloride Cr 10 Meq Tbcr (Potassium chloride) .Marland Kitchen.. 1 by mouth once daily with furosemide 13)  Zolpidem Tartrate 10 Mg Tabs (Zolpidem tartrate) .... 1/2 or 1 by mouth at hs prn 14)   Aspir-low 81 Mg Tbec (Aspirin) .Marland Kitchen.. 1 by mouth once daily pc 15)  Pennsaid 1.5 % Soln (Diclofenac sodium) .... 3-5 gtt on skin three times a day for pain 16)  Omeprazole 40 Mg Cpdr (Omeprazole) .Marland Kitchen.. 1 by mouth qam for indigestion 17)  Prednisone 10 Mg Tabs (Prednisone) .... Take 40mg  qd for 3 days, then 20 mg qd for 3 days, then 10mg  qd for 6 days, then stop. take pc. 18)  Sumavel Dosepro 6 Mg/0.22ml Devi (Sumatriptan succinate) .... Use once daily as needed migraine as dirr. 19)  Relpax 40 Mg Tabs (Eletriptan hydrobromide) .Marland Kitchen.. 1 by mouth once daily prn 20)  Hydroxyzine Hcl 50 Mg Tabs (Hydroxyzine hcl) .Marland Kitchen.. 1 by mouth at bedtime prn  Patient Instructions: 1)  Please schedule a follow-up appointment in 2 weeks. 2)  Call if you are not better in a reasonable amount of time or if worse. Go to ER if feeling really bad!  Prescriptions: HYDROXYZINE HCL 50 MG TABS (HYDROXYZINE HCL) 1 by mouth at bedtime prn  #90 x 3   Entered and Authorized by:   Tresa Garter MD   Signed by:   Tresa Garter MD on 02/15/2010   Method used:   Print then Give to Patient   RxID:   [id number] RELPAX 40 MG TABS (ELETRIPTAN HYDROBROMIDE) 1 by mouth once daily prn  #12 x 6   Entered and Authorized by:   Tresa Garter MD   Signed by:   Tresa Garter MD on 02/15/2010   Method used:   Print then Give to Patient   RxID:   [id number] SUMAVEL DOSEPRO 6 MG/0.5ML DEVI (SUMATRIPTAN SUCCINATE) use once daily as needed migraine as dirr.  #6 x 12   Entered and Authorized by:   Tresa Garter MD   Signed by:   Tresa Garter MD on 02/15/2010   Method used:   Print then Give to Patient   RxID:   [id number] PREDNISONE 10 MG TABS (PREDNISONE) Take 40mg  qd for 3 days, then 20 mg qd for 3 days, then 10mg  qd for 6 days, then stop. Take pc.  #24 x 1   Entered and Authorized by:   Tresa Garter MD   Signed by:   Tresa Garter MD on 02/15/2010   Method used:   Print then Give  to Patient   RxID:   [id number]

## 2010-10-09 NOTE — Progress Notes (Signed)
Summary: NEXIUM  Phone Note Call from Patient   Summary of Call: Patient is requesting that we call [facility] and tell them that the Nexium was prescribed by mistake and they will take med back and refund pt's money. Nexium is too expensive. If ok, do you want to do omeprazole as alternative?  Initial call taken by: Lamar Sprinkles, CMA,  September 12, 2009 8:43 AM  Follow-up for Phone Call        OK pls call Follow-up by: Tresa Garter MD,  September 12, 2009 1:10 PM  Additional Follow-up for Phone Call Additional follow up Details #1::        Phoned new prescription to [facility] and per [facility] they will not give refund/credit because they filled the prescription correctly. Patient would have to call and speak with a supervisor to discuss credit. Called patient to notify, left message on machine to call back to office. Additional Follow-up by: Lucious Groves,  September 13, 2009 2:28 PM    New/Updated Medications: OMEPRAZOLE 40 MG CPDR (OMEPRAZOLE) 1 by mouth qam for indigestion Prescriptions: OMEPRAZOLE 40 MG CPDR (OMEPRAZOLE) 1 by mouth qam for indigestion  #90 x 3   Entered by:   Lucious Groves   Authorized by:   Tresa Garter MD   Signed by:   Lucious Groves on 09/13/2009   Method used:   [facility]  [address] [phone][facility] (retail[facility]       [address]       [place], [place]  [id number]       Ph: [phone] or [phone]       Fax: [phone]   RxID:   [id number]

## 2010-10-09 NOTE — Progress Notes (Signed)
Summary: Ambien request  Phone Note Call from Patient Call back at Home Phone [phone]   Summary of Call: Patient called stating [place] and would like prescription for Ambien. Please advise. Initial call taken by: Lucious Groves,  March 16, 2010 8:37 AM  Follow-up for Phone Call        ok Follow-up by: Tresa Garter MD,  March 16, 2010 5:46 PM  Additional Follow-up for Phone Call Additional follow up Details #1::        left message on machine to call back to office. Lucious Groves,  March 19, 2010 1:43 PM  Left detailed vm on pt's home #, ok per HIPPA Additional Follow-up by: Lamar Sprinkles, CMA,  March 19, 2010 5:32 PM    New/Updated Medications: ZOLPIDEM TARTRATE 10 MG  TABS (ZOLPIDEM TARTRATE) 1/2 or 1 by mouth at hs prn Prescriptions: ZOLPIDEM TARTRATE 10 MG  TABS (ZOLPIDEM TARTRATE) 1/2 or 1 by mouth at hs prn  #30 x 3   Entered by:   Lucious Groves   Authorized by:   Tresa Garter MD   Signed by:   Lucious Groves on 03/19/2010   Method used:   Printed then faxed to ...       [facility]  [address] [phone]* (retail)       [address]       [place], [place]  [id number]       Ph: [phone] or [phone]       Fax: [phone]   RxID:   [id number]

## 2010-10-09 NOTE — Letter (Signed)
Summary: Pre Visit Letter Revised  [facility]  [address] [place], [place] [address]   Phone: [phone]  Fax: [phone]        06/29/2010 MRN: [id number] [facility] [address] [place], [place]  [address]             Procedure Date:  08-14-10   Welcome to the Gastroenterology Division at [facility].    You are scheduled to see a nurse for your pre-procedure visit on 07-24-10 at 4:00p.m. on the 3rd floor at [facility], [address]  We ask that you try to arrive at our office 15 minutes prior to your appointment time to allow for check-in.  Please take a minute to review the attached form.  If you answer "Yes" to one or more of the questions on the first page, we ask that you call the person listed at your earliest opportunity.  If you answer "No" to all of the questions, please complete the rest of the form and bring it to your appointment.    Your nurse visit will consist of discussing your medical and surgical history, your immediate family medical history, and your medications.   If you are unable to list all of your medications on the form, please bring the medication bottles to your appointment and we will list them.  We will need to be aware of both prescribed and over the counter drugs.  We will need to know exact dosage information as well.    Please be prepared to read and sign documents such as consent forms, a financial agreement, and acknowledgement forms.  If necessary, and with your consent, a friend or relative is welcome to sit-in on the nurse visit with you.  Please bring your insurance card so that we may make a copy of it.  If your insurance requires a referral to see a specialist, please bring your referral form from your primary care physician.  No co-pay is required for this nurse visit.     If you cannot keep your appointment, please call [phone] to cancel or reschedule prior to your appointment date.  This allows [place] the  opportunity to schedule an appointment for another patient in need of care.    Thank you for choosing  Gastroenterology for your medical needs.  We appreciate the opportunity to care for you.  Please visit [place] at our website  to learn more about our practice.  Sincerely, The Gastroenterology Division

## 2010-10-09 NOTE — Progress Notes (Signed)
Summary: Sumavel denial  Phone Note Other Incoming   Summary of Call: Cecille Rubin has been denied. Patient does not have sufficient diagnosis to require the needle free device. Please advise. Initial call taken by: Lucious Groves,  February 27, 2010 3:20 PM  Follow-up for Phone Call        Dx:  Intract migraine 346.11  Thx! Follow-up by: Tresa Garter MD,  February 27, 2010 5:45 PM  Additional Follow-up for Phone Call Additional follow up Details #1::        Patient does not have disability that prevents her from using needle device, please provide alternative. Additional Follow-up by: Lucious Groves,  March 02, 2010 10:54 AM  New Problems: MIGRAINE, COMMON, INTRACTABLE (ICD-346.11)   Additional Follow-up for Phone Call Additional follow up Details #2::    [place] Sumatriptan Follow-up by: Tresa Garter MD,  March 02, 2010 12:38 PM  Additional Follow-up for Phone Call Additional follow up Details #3:: Details for Additional Follow-up Action Taken: Sent and will wait to see if prior authorization is needed. Lucious Groves  March 02, 2010 2:29 PM   Office has not received prior authorization request. Closed phone note. Lucious Groves  March 05, 2010 10:17 AM   New Problems: MIGRAINE, COMMON, INTRACTABLE (ICD-346.11) New/Updated Medications: IMITREX STATDOSE SYSTEM 6 MG/0.5ML KIT (SUMATRIPTAN SUCCINATE) [place] as dirr prn Prescriptions: IMITREX STATDOSE SYSTEM 6 MG/0.5ML KIT (SUMATRIPTAN SUCCINATE) [place] as dirr prn  #6 x 6   Entered and Authorized by:   Tresa Garter MD   Signed by:   Lucious Groves on 03/02/2010   Method used:   [facility]  [address] [phone]* (retail)       [address]       [place], [place]  [address]       Ph: [phone] or [phone]       Fax: [phone]   RxID:   [id number]

## 2010-10-09 NOTE — Miscellaneous (Signed)
Summary: [facility]  Clinical Lists Changes  Medications: Added new medication of MOVIPREP 100 GM  SOLR (PEG-KCL-NACL-NASULF-NA ASC-C) As per prep instructions. - Signed Rx of MOVIPREP 100 GM  SOLR (PEG-KCL-NACL-NASULF-NA ASC-C) As per prep instructions.;  #1 x 0;  Signed;  Entered by: Ezra Sites RN;  Authorized by: Meryl Dare MD [facility];  Method used: [facility] [address] [phone]*, [address] [place], [place], [place]  [address], Ph: [phone] or [phone], Fax: [phone] Allergies: Changed allergy or adverse reaction from CODEINE SULFATE (CODEINE SULFATE) to CODEINE SULFATE (CODEINE SULFATE)    Prescriptions: MOVIPREP 100 GM  SOLR (PEG-KCL-NACL-NASULF-NA ASC-C) As per prep instructions.  #1 x 0   Entered by:   Ezra Sites RN   Authorized by:   Meryl Dare MD [facility]   Signed by:   Ezra Sites RN on 07/24/2010   Method used:   Electronically to        [facility] [address] [phone]* (retail)       [address]       [place], [place]  [address]       Ph: [phone] or [phone]       Fax: [phone]   RxID:   [id number]   Appended Document: Colonoscopy     Procedures Next Due Date:    Colonoscopy: 08/2015

## 2010-10-11 NOTE — Procedures (Signed)
Summary: Colonoscopy  Patient: Sheila Hall Note: All result statuses are Final unless otherwise noted.  Tests: (1) Colonoscopy (COL)   COL Colonoscopy           DONE     [facility]     [address]     [place], [place]  [address]           COLONOSCOPY PROCEDURE REPORT     PATIENT:  Sheila Hall, Sheila Hall  MR#:  [id number]     BIRTHDATE:  Jul 15, 1951, 58 yrs. old  GENDER:  female     ENDOSCOPIST:  Judie Petit T. Russella Dar, MD, [facility]           PROCEDURE DATE:  08/14/2010     PROCEDURE:  Colonoscopy with biopsy and snare polypectomy     ASA CLASS:  Class II     INDICATIONS:  1) Elevated Risk Screening  2) family history of     colon cancer: father at 8.     MEDICATIONS:   Fentanyl 125 mcg IV, Versed 12 mg IV, Benadryl 25     mg IV     DESCRIPTION OF PROCEDURE:   After the risks benefits and     alternatives of the procedure were thoroughly explained, informed     consent was obtained.  Digital rectal exam was performed and     revealed no abnormalities.   The LB [id number] endoscope     was introduced through the anus and advanced to the cecum, which     was identified by both the appendix and ileocecal valve, limited     by extreme patient discomfort, a tortuous colon.    The quality of     the prep was excellent, using MoviPrep.  The instrument was then     slowly withdrawn as the colon was fully examined.     <<PROCEDUREIMAGES>>     FINDINGS:  A sessile polyp was found in the proximal transverse     colon. It was 3 mm in size. The polyp was removed using cold     biopsy forceps.  A sessile polyp was found in the mid transverse     colon. It was 5 mm in size. Polyp was snared without cautery.     Retrieval was successful. Scattered diverticula were found in the     transverse colon.  Moderate diverticulosis was found in the     sigmoid to descending colon.  This was otherwise a normal     examination of the colon. Retroflexed views in the rectum revealed     no  abnormalities. The time to cecum =  10  minutes. The scope was     then withdrawn (time =  9  min) from the patient and the procedure     completed.     COMPLICATIONS:  None           ENDOSCOPIC IMPRESSION:     1) 3 mm sessile polyp in the proximal transverse colon     2) 5 mm sessile polyp in the mid transverse colon     3) Diverticula, scattered in the transverse colon     4) Moderate diverticulosis in the sigmoid to descending colon           RECOMMENDATIONS:     1) Await pathology results     2) High fiber diet with liberal fluid intake.     3) Repeat Colonoscopy in 5 years, pending pathology review.  4) Consider MAC sedation for next colonoscopy           Malcolm T. Russella Dar, MD, [facility]           CC: Linda Hedges. Plotnikov, MD           n.     Rosalie DoctorJudie Petit T. Stark at 08/14/2010 09:06 AM           Ileene Hutchinson, [id number]  Note: An exclamation mark (!) indicates a result that was not dispersed into the flowsheet. Document Creation Date: 08/14/2010 9:07 AM _______________________________________________________________________  (1) Order result status: Final Collection or observation date-time: 08/14/2010 08:58 Requested date-time:  Receipt date-time:  Reported date-time:  Referring Physician:   Ordering Physician: Claudette Head [phone]) Specimen Source:  Source: Launa Grill Order Number: [id number] Lab site:   Appended Document: Colonoscopy     Procedures Next Due Date:    Colonoscopy: 08/2015

## 2010-10-31 ENCOUNTER — Other Ambulatory Visit: Payer: Self-pay | Admitting: Orthopedic Surgery

## 2010-10-31 DIAGNOSIS — M25572 Pain in left ankle and joints of left foot: Secondary | ICD-10-CM

## 2010-11-01 ENCOUNTER — Ambulatory Visit
Admission: RE | Admit: 2010-11-01 | Discharge: 2010-11-01 | Disposition: A | Discharge status: Home / Self Care | Payer: BC Managed Care – PPO | Source: Ambulatory Visit | Attending: Orthopedic Surgery | Admitting: Orthopedic Surgery

## 2010-11-01 DIAGNOSIS — M25572 Pain in left ankle and joints of left foot: Secondary | ICD-10-CM

## 2010-11-21 ENCOUNTER — Ambulatory Visit (INDEPENDENT_AMBULATORY_CARE_PROVIDER_SITE_OTHER): Payer: BC Managed Care – PPO | Admitting: Internal Medicine

## 2010-11-21 ENCOUNTER — Encounter: Payer: Self-pay | Admitting: Internal Medicine

## 2010-11-21 ENCOUNTER — Other Ambulatory Visit: Payer: BC Managed Care – PPO

## 2010-11-21 ENCOUNTER — Other Ambulatory Visit: Payer: Self-pay | Admitting: Internal Medicine

## 2010-11-21 DIAGNOSIS — R509 Fever, unspecified: Secondary | ICD-10-CM

## 2010-11-21 DIAGNOSIS — R1084 Generalized abdominal pain: Secondary | ICD-10-CM | POA: Insufficient documentation

## 2010-11-21 DIAGNOSIS — K5732 Diverticulitis of large intestine without perforation or abscess without bleeding: Secondary | ICD-10-CM | POA: Insufficient documentation

## 2010-11-21 DIAGNOSIS — R110 Nausea: Secondary | ICD-10-CM

## 2010-11-21 LAB — URINALYSIS, ROUTINE W REFLEX MICROSCOPIC
Ketones, ur: NEGATIVE
pH: 5.5 (ref 5.0–8.0)

## 2010-11-21 LAB — HEPATIC FUNCTION PANEL
AST: 24 U/L (ref 0–37)
Albumin: 3.9 g/dL (ref 3.5–5.2)
Alkaline Phosphatase: 74 U/L (ref 39–117)
Bilirubin, Direct: 0.1 mg/dL (ref 0.0–0.3)

## 2010-11-21 LAB — CBC WITH DIFFERENTIAL/PLATELET
Basophils Absolute: 0 10*3/uL (ref 0.0–0.1)
Eosinophils Absolute: 0.3 10*3/uL (ref 0.0–0.7)
Eosinophils Relative: 3.1 % (ref 0.0–5.0)
HCT: 37.7 % (ref 36.0–46.0)
Hemoglobin: 12.9 g/dL (ref 12.0–15.0)
Lymphs Abs: 3 10*3/uL (ref 0.7–4.0)
Monocytes Relative: 8 % (ref 3.0–12.0)
Neutro Abs: 5.4 10*3/uL (ref 1.4–7.7)
RBC: 4.07 Mil/uL (ref 3.87–5.11)
RDW: 13.1 % (ref 11.5–14.6)

## 2010-11-21 LAB — BASIC METABOLIC PANEL
BUN: 13 mg/dL (ref 6–23)
Glucose, Bld: 89 mg/dL (ref 70–99)
Potassium: 3.7 mEq/L (ref 3.5–5.1)
Sodium: 136 mEq/L (ref 135–145)

## 2010-11-22 LAB — SEDIMENTATION RATE: Sed Rate: 61 mm/hr — ABNORMAL HIGH (ref 0–22)

## 2010-11-27 NOTE — Assessment & Plan Note (Signed)
Summary: BODY ACHES  --REFUSED ANOTHER MD---STC   Vital Signs:  Patient profile:   60 year old female Height:      68 inches Weight:      191 pounds BMI:     29.15 Temp:     98.7 degrees F oral Pulse rate:   76 / minute Pulse rhythm:   regular Resp:     16 per minute BP sitting:   130 / 92  (left arm) Cuff size:   regular  Vitals Entered By: Lanier Prude, Beverly Gust) (November 21, 2010 4:43 PM) CC: body aches,fatigue, abd cramps, low grade fever Is Patient Diabetic? No Comments pt states she IS ONLY  taking Estrace, tramadol, Proair,vit d and Hydroxyzine   Primary Care Provider:  Sonda Primes  CC:  body aches, fatigue, abd cramps, and low grade fever.  History of Present Illness: C/o low grade fever, aches and chills since last Thursday C/o abd pain - diffuse more in the center, better w/Tramadol 6/10 in intensity No recent travel  Current Medications (verified): 1)  Estrace 1 Mg  Tabs (Estradiol) .... 1/2 Qd 2)  Allegra 180 Mg  Tabs (Fexofenadine Hcl) .... As Needed 3)  Tramadol Hcl 50 Mg  Tabs (Tramadol Hcl) .Marland Kitchen.. 1or2 Two Times A Day  Prn 4)  Proair Hfa 108 (90 Base) Mcg/act  Aers (Albuterol Sulfate) .... 2 Inh Q4h As Needed Shortness of Breath 5)  Vitamin D3 1000 Unit  Tabs (Cholecalciferol) .Marland Kitchen.. 1 By Mouth Qd 6)  Triamcinolone Acetonide 0.025 % Crea (Triamcinolone Acetonide) .... Apply Bid To Affected Area 7)  Anamantle Hc 3-2.5 % Kit (Lidocaine-Hydrocortisone Ace) .... Use Gel Three Times A Day X 10 Days and Then Prn 8)  Promethazine Hcl 25 Mg Supp (Promethazine Hcl) .Marland Kitchen.. 1-2 Pr Qid Prn 9)  Promethazine Hcl 25 Mg  Tabs (Promethazine Hcl) .Marland Kitchen.. 1 By Mouth Qid As Needed Nausea 10)  Ditropan Xl 10 Mg Xr24h-Tab (Oxybutynin Chloride) .Marland Kitchen.. 1 By Mouth Qd 11)  Lasix 40 Mg Tabs (Furosemide) .Marland Kitchen.. 1 By Mouth Once Daily As Needed Swelling 12)  Potassium Chloride Cr 10 Meq  Tbcr (Potassium Chloride) .Marland Kitchen.. 1 By Mouth Once Daily With Furosemide 13)  Zolpidem Tartrate 10 Mg  Tabs  (Zolpidem Tartrate) .... 1/2 or 1 By Mouth At [facility] Prn 14)  Aspir-Low 81 Mg Tbec (Aspirin) .Marland Kitchen.. 1 By Mouth Once Daily Pc 15)  Pennsaid 1.5 % Soln (Diclofenac Sodium) .... 3-5 Gtt On Skin Three Times A Day For Pain 16)  Omeprazole 40 Mg Cpdr (Omeprazole) .Marland Kitchen.. 1 By Mouth Qam For Indigestion 17)  Prednisone 10 Mg Tabs (Prednisone) .... Take 40mg  Qd For 3 Days, Then 20 Mg Qd For 3 Days, Then 10mg  Qd For 6 Days, Then Stop. Take Pc. 18)  Relpax 40 Mg Tabs (Eletriptan Hydrobromide) .Marland Kitchen.. 1 By Mouth Once Daily Prn 19)  Hydroxyzine Hcl 50 Mg Tabs (Hydroxyzine Hcl) .Marland Kitchen.. 1 By Mouth At Bedtime Prn 20)  Imitrex Statdose System 6 Mg/0.11ml Kit (Sumatriptan Succinate) .... [place] As Dirr Prn  Allergies (verified): 1)  ! Codeine Sulfate (Codeine Sulfate)  Past History:  Past Medical History: Diverticulitis, hx of Diverticulosis, colon - all 3 divisions, last colonoscopy 2011 Dr Russella Dar Hyperlipidemia Osteoporosis Urinary incontinence GERD Family Hx colon CA Arthritis Asthma Migraines  Review of Systems       The patient complains of anorexia, fever, and abdominal pain.  The patient denies chest pain, melena, hematochezia, and severe indigestion/heartburn.    Physical Exam  General:  alert,  well-developed, well-nourished, well-hydrated, cooperative to examination, good hygiene, and overweight-appearing.   Nose:  External nasal examination shows no deformity or inflammation. Nasal mucosa are pink and moist without lesions or exudates. Mouth:  Oral mucosa and oropharynx without lesions or exudates.  Teeth in good repair. Lungs:  Normal respiratory effort, chest expands symmetrically. Lungs are clear to auscultation, no crackles or wheezes. Heart:  Normal rate and regular rhythm. S1 and S2 normal without gallop, murmur, click, rub or other extra sounds. Abdomen:  Bowel sounds positive,abdomen soft and  without masses, organomegaly or hernias noted. It is tender over transverse colonno hepatomegaly and no  splenomegaly.   Msk:  No deformity or scoliosis noted of thoracic or lumbar spine.  L akle in a brace Neurologic:  No cranial nerve deficits noted. Station and gait are normal. Plantar reflexes are down-going bilaterally. DTRs are symmetrical throughout. Sensory, motor and coordinative functions appear intact. o pronator drift, Romberg neg Skin:  Intact without suspicious lesions or rashes Psych:  Cognition and judgment appear intact. Alert and cooperative with normal attention span and concentration. No apparent delusions, illusions, hallucinations   Impression & Recommendations:  Problem # 1:  ABDOMINAL PAIN, GENERALIZED (ICD-789.07) likely due to #2 Assessment New Tramadol prn Orders: TLB-Hepatic/Liver Function Pnl (80076-HEPATIC) TLB-CBC Platelet - w/Differential (85025-CBCD) TLB-Sedimentation Rate (ESR) (85652-ESR) TLB-Udip ONLY (81003-UDIP) TLB-BMP (Basic Metabolic Panel-BMET) (80048-METABOL) TLB-Lipase (83690-LIPASE)  Problem # 2:  DIVERTICULITIS OF COLON (ICD-562.11) Assessment: New Start Flagyl and Cipro  Problem # 3:  NAUSEA (ICD-787.02) Assessment: New Prometh prn Orders: TLB-Hepatic/Liver Function Pnl (80076-HEPATIC) TLB-CBC Platelet - w/Differential (85025-CBCD) TLB-Sedimentation Rate (ESR) (85652-ESR) TLB-Udip ONLY (81003-UDIP) TLB-BMP (Basic Metabolic Panel-BMET) (80048-METABOL) TLB-Lipase (83690-LIPASE)  Problem # 4:  FEVER UNSPECIFIED (ICD-780.60) Assessment: New  Orders: TLB-Hepatic/Liver Function Pnl (80076-HEPATIC) TLB-CBC Platelet - w/Differential (85025-CBCD) TLB-Sedimentation Rate (ESR) (85652-ESR) TLB-Udip ONLY (81003-UDIP) TLB-BMP (Basic Metabolic Panel-BMET) (80048-METABOL) TLB-Lipase (83690-LIPASE)  Complete Medication List: 1)  Estrace 1 Mg Tabs (Estradiol) .... 1/2 qd 2)  Allegra 180 Mg Tabs (Fexofenadine hcl) .... As needed 3)  Tramadol Hcl 50 Mg Tabs (Tramadol hcl) .Marland Kitchen.. 1or2 two times a day  prn 4)  Proair Hfa 108 (90 Base) Mcg/act Aers  (Albuterol sulfate) .... 2 inh q4h as needed shortness of breath 5)  Vitamin D3 1000 Unit Tabs (Cholecalciferol) .Marland Kitchen.. 1 by mouth qd 6)  Triamcinolone Acetonide 0.025 % Crea (Triamcinolone acetonide) .... Apply bid to affected area 7)  Anamantle Hc 3-2.5 % Kit (Lidocaine-hydrocortisone ace) .... Use gel three times a day x 10 days and then prn 8)  Promethazine Hcl 25 Mg Supp (Promethazine hcl) .Marland Kitchen.. 1-2 pr qid prn 9)  Promethazine Hcl 25 Mg Tabs (Promethazine hcl) .Marland Kitchen.. 1 by mouth qid as needed nausea 10)  Ditropan Xl 10 Mg Xr24h-tab (Oxybutynin chloride) .Marland Kitchen.. 1 by mouth qd 11)  Lasix 40 Mg Tabs (Furosemide) .Marland Kitchen.. 1 by mouth once daily as needed swelling 12)  Potassium Chloride Cr 10 Meq Tbcr (Potassium chloride) .Marland Kitchen.. 1 by mouth once daily with furosemide 13)  Zolpidem Tartrate 10 Mg Tabs (Zolpidem tartrate) .... 1/2 or 1 by mouth at hs prn 14)  Aspir-low 81 Mg Tbec (Aspirin) .Marland Kitchen.. 1 by mouth once daily pc 15)  Pennsaid 1.5 % Soln (Diclofenac sodium) .... 3-5 gtt on skin three times a day for pain 16)  Omeprazole 40 Mg Cpdr (Omeprazole) .Marland Kitchen.. 1 by mouth qam for indigestion 17)  Prednisone 10 Mg Tabs (Prednisone) .... Take 40mg  qd for 3 days, then 20 mg qd for 3 days,  then 10mg  qd for 6 days, then stop. take pc. 18)  Relpax 40 Mg Tabs (Eletriptan hydrobromide) .Marland Kitchen.. 1 by mouth once daily prn 19)  Hydroxyzine Hcl 50 Mg Tabs (Hydroxyzine hcl) .Marland Kitchen.. 1 by mouth at bedtime prn 20)  Imitrex Statdose System 6 Mg/0.66ml Kit (Sumatriptan succinate) .... [place] as dirr prn 21)  Ciprofloxacin Hcl 500 Mg Tabs (Ciprofloxacin hcl) .Marland Kitchen.. 1 by mouth bid 22)  Metronidazole 500 Mg Tabs (Metronidazole) .Marland Kitchen.. 1 by mouth two times a day x 10 d  Patient Instructions: 1)  Call if you are not better in a reasonable amount of time or if worse. Go to ER if feeling really bad!  Prescriptions: METRONIDAZOLE 500 MG TABS (METRONIDAZOLE) 1 by mouth two times a day x 10 d  #20 x 1   Entered and Authorized by:   Tresa Garter MD    Signed by:   Tresa Garter MD on 11/21/2010   Method used:   Print then Give to Patient   RxID:   [id number] CIPROFLOXACIN HCL 500 MG TABS (CIPROFLOXACIN HCL) 1 by mouth bid  #20 x 1   Entered and Authorized by:   Tresa Garter MD   Signed by:   Tresa Garter MD on 11/21/2010   Method used:   Print then Give to Patient   RxID:   [id number] TRAMADOL HCL 50 MG  TABS (TRAMADOL HCL) 1or2 two times a day  prn  #360 x 3   Entered and Authorized by:   Tresa Garter MD   Signed by:   Tresa Garter MD on 11/21/2010   Method used:   Faxed to ...       [facility] (mail-order)             , [place]         Ph: [phone]       Fax: [phone]   RxID:   [id number] METRONIDAZOLE 500 MG TABS (METRONIDAZOLE) 1 by mouth two times a day x 10 d  #20 x 1   Entered and Authorized by:   Tresa Garter MD   Signed by:   Tresa Garter MD on 11/21/2010   Method used:   [facility]        [facility]  [address] [phone]* (retail)       [address]       [place], [place]  [address]       Ph: [phone] or [phone]       Fax: [phone]   RxID:   [id number] CIPROFLOXACIN HCL 500 MG TABS (CIPROFLOXACIN HCL) 1 by mouth bid  #20 x 1   Entered and Authorized by:   Tresa Garter MD   Signed by:   Tresa Garter MD on 11/21/2010   Method used:   [facility]        [facility]  [address] [phone]* (retail)       [address]       [place], [place]  [address]       Ph: [phone] or [phone]       Fax: [phone]   RxID:   [id number]    Orders Added: 1)  TLB-Hepatic/Liver Function Pnl [80076-HEPATIC] 2)  TLB-CBC Platelet - w/Differential [85025-CBCD] 3)  TLB-Sedimentation Rate (ESR) [85652-ESR] 4)  TLB-Udip ONLY [81003-UDIP] 5)  TLB-BMP (Basic Metabolic Panel-BMET) [80048-METABOL] 6)  TLB-Lipase [83690-LIPASE] 7)  Est. Patient Level IV [[address]]

## 2010-12-19 ENCOUNTER — Encounter: Payer: Self-pay | Admitting: Internal Medicine

## 2011-01-22 NOTE — Assessment & Plan Note (Signed)
[facility]                         GASTROENTEROLOGY OFFICE NOTE   CYNIA, ABRUZZO                   MRN:          [id number]  DATE:09/25/2007                            DOB:          [birth year]    This is a return office visit for GERD with a hiatal hernia.  She has  changed Protonix to daily, and all of her gastrointestinal complaints  are under excellent control.  She has no epigastric pain or reflux  symptoms.   CURRENT MEDICATIONS:  Listed on the chart, updated and reviewed.   MEDICATION ALLERGIES:  1. CODEINE.  2. NSAIDS.  3. MORPHINE.   EXAMINATION:  In no acute distress.  Weight 179.6.  Blood pressure is 136/80.  Pulse 60 and regular.  CHEST:  Clear to auscultation bilaterally.  CARDIAC:  Regular rate and rhythm without murmurs appreciated.  ABDOMEN:  Soft and nontender with normoactive bowel sounds.   ASSESSMENT AND PLAN:  1. Gastroesophageal reflux disease.  Maintain standard anti-reflux      measures and Protonix 40 mg p.o. q.a.m.  Ongoing followup with Dr.      Posey Rea.  I will see as needed.  2. Family history of colon cancer in her father at age 34.  Recall      colonoscopy recommended for October 2011.     Venita Lick. Russella Dar, MD, [facility]  Electronically Signed    MTS/MedQ  DD: 09/29/2007  DT: 09/29/2007  Job #: [id number]   cc:   Georgina Quint. Plotnikov, MD

## 2011-01-22 NOTE — Assessment & Plan Note (Signed)
 [facility]                         GASTROENTEROLOGY OFFICE NOTE   ATHENIA, RYS                   MRN:          [id number]  DATE:07/21/2007                            DOB:          1950-09-15    REFERRING PHYSICIAN:  Georgina Quint. Plotnikov, MD   REQUESTING PHYSICIAN:  Georgina Quint. Plotnikov, MD.   REASON FOR CONSULTATION:  Epigastric pain.   HISTORY OF PRESENT ILLNESS:  Sheila Hall is a 60 year old white female  that I have previously evaluated.  She has a father with colon cancer at  age 87 and she underwent colonoscopy in December 2006, that revealed  only diverticulosis.  She has a history of GERD and diverticulitis. She  takes Prilosec on an ongoing basis for reflux symptoms.  She started  Lodine for low back pain several weeks ago and related worsening  problems with epigastric pain.  This was changed to Mobic with similar  symptoms.  She relates no nausea, vomiting, melena or hematochezia.  Carafate was recently added, however, her symptoms have not improved.   PAST MEDICAL HISTORY:  1. Diverticulosis.  2. Diverticulitis.  3. GERD.  4. Osteopenia.  5. Hyperlipidemia.  6. First degree relative with colon cancer.  7. Status post hysterectomy.  8. Status post appendectomy.  9. Status post bilateral tubal ligation.  10.Status post cholecystectomy.  11.Arthritis.  12.Asthma.  13.Allergies.   CURRENT MEDICATIONS:  Listed on the chart, updated and reviewed.   ALLERGIES:  NO KNOWN DRUG ALLERGIES.   SOCIAL HISTORY:  Per the handwritten form.   REVIEW OF SYSTEMS:  Per the handwritten form.   PHYSICAL EXAMINATION:  VITAL SIGNS:  Height 5 feet 8 inches, weight  182.4 pounds, blood pressure 140/62, pulse 100 and regular.  GENERAL APPEARANCE:  Well-developed, well-nourished, no acute distress.  HEENT:  Sclerae are anicteric.  Oropharynx clear.  CHEST:  Clear to auscultation bilaterally.  CARDIOVASCULAR:  Regular rate and rhythm  without murmurs appreciated.  ABDOMEN:  Soft, nondistended with moderate epigastric tenderness to deep  palpation.  No rebound or guarding.  No palpable organomegaly, masses or  hernias.  Normal active bowel sounds.  EXTREMITIES:  Without clubbing, cyanosis, or edema.  NEUROLOGIC:  Alert and oriented x3, grossly nonfocal.   ASSESSMENT/PLAN:  Worsening epigastric pain, rule out ulcer disease,  gastritis and duodenitis secondary to nonsteroidal anti-inflammatory  drugs.  She is to avoid all nonsteroidal anti-inflammatory drugs and  increase Prilosec to 20 mg p.o. b.i.d.  Risks, benefits, and  alternatives to upper endoscopy with possible biopsy discussed with the  patient.  She consents to proceed.  This was scheduled electively.     Sheila Hall. Russella Dar, MD, [facility]  Electronically Signed    MTS/MedQ  DD: 07/24/2007  DT: 07/24/2007  Job #: [id number]

## 2011-01-25 NOTE — Assessment & Plan Note (Signed)
Sheila Hall                             PRIMARY CARE OFFICE NOTE   Sheila Hall, Sheila Hall                   MRN:          [id number]  DATE:06/11/2006                            DOB:          1951/02/13    The patient is a 60 year old female who presents for a wellness examination.   ALLERGIES:  None.   PAST MEDICAL HISTORY:  As per February 21, 2005, note.   FAMILY HISTORY:  As per February 21, 2005, note.   SOCIAL HISTORY:  As per February 21, 2005, note.   CURRENT MEDICATIONS:  Fosamax, Premarin, Prilosec, and albuterol MDI p.r.n.   REVIEW OF SYSTEMS:  Negative for chest pain or shortness of breath.  Some  unpleasant sensation in the right arm similar to itching.  Right shoulder  pain over the past several weeks, off and on, usually at night.  She has  painful cyst on the right third finger, otherwise negative.   PHYSICAL:  She is in no acute distress, looks well.  Blood pressure 145/89.  Pulse 77.  Temp 98.4.  Weight 187 pounds.  HEENT:  With moist mucosa.  NECK:  Supple.  No thyromegaly or bruits.  LUNGS:  Clear to auscultation and percussion.  No wheezes or rales.  HEART:  S1, S2.  No murmur.  No gallop.  ABDOMEN:  Soft, nontender.  No organomegaly or mass felt.  LOWER EXTREMITIES:  Without edema.  She is alert and cooperative.  Denies being depressed.  Neck with full range of motion.  Shoulders with full range of motion.  No  deformities.  The reflexes and muscle strength normal.  No musculature  atrophy.   Labs on June 04, 2006, CBC normal.  HDL 65, cholesterol 216, and LDL  110.  BMET normal.  TSH normal.  Urinalysis normal.   ASSESSMENT AND PLAN:  1. Normal wellness examination.  Age/health-related issues discussed.      Healthy lifestyle discussed.  Start aspirin 81 mg daily.  Flu shot at      work.  Obtain chest x-ray.  She had a CT of the abdomen and pelvis in      2006, normal.  Mammogram every 12 months.  2. Right shoulder  pain.  Paresthesia in the right arm.  Other etiology      possible neuropathy/radiculopathy.  We will need a cervical spine      MRI/neurosurgical consult if problem continuing.  Neurontin 100 mg 1 to      4 at night.  3. Possible carpal tunnel syndrome.  Plan as above.  4. Dysuria.  She stopped Bactroban, may need to restart.  5. Right third finger, painful cyst.  Orthopedic surgery was consulted if      she decides to have it removed.            ______________________________  Georgina Quint Plotnikov, MD      AVP/MedQ  DD:  06/18/2006  DT:  06/19/2006  Job #:  [id number]

## 2011-04-04 ENCOUNTER — Encounter: Payer: Self-pay | Admitting: Internal Medicine

## 2011-04-04 ENCOUNTER — Ambulatory Visit (INDEPENDENT_AMBULATORY_CARE_PROVIDER_SITE_OTHER): Payer: BC Managed Care – PPO | Admitting: Internal Medicine

## 2011-04-04 DIAGNOSIS — L237 Allergic contact dermatitis due to plants, except food: Secondary | ICD-10-CM

## 2011-04-04 DIAGNOSIS — R51 Headache: Secondary | ICD-10-CM

## 2011-04-04 DIAGNOSIS — L255 Unspecified contact dermatitis due to plants, except food: Secondary | ICD-10-CM

## 2011-04-04 MED ORDER — HYDROXYZINE HCL 50 MG PO TABS: 50.0000 mg | ORAL_TABLET | Freq: Every evening | ORAL | Status: DC | PRN

## 2011-04-04 MED ORDER — PREDNISONE (PAK) 10 MG PO TABS: 10.0000 mg | ORAL_TABLET | ORAL | Status: AC

## 2011-04-04 MED ORDER — METHYLPREDNISOLONE ACETATE 80 MG/ML IJ SUSP
120.0000 mg | Freq: Once | INTRAMUSCULAR | Status: AC
  Administered 2011-04-04: 120 mg via INTRAMUSCULAR

## 2011-04-04 MED ORDER — TRIAMCINOLONE ACETONIDE 0.1 % EX OINT: TOPICAL_OINTMENT | Freq: Two times a day (BID) | CUTANEOUS | Status: AC | PRN

## 2011-04-04 NOTE — Patient Instructions (Signed)
It was good to see you today. Steroid shot given today for allergic skin reaction Also use Pred dose pak, Kenalog and hydroxyzine as discussed to help control itch symptoms - Your prescription(s) have been submitted to your local pharmacy. Please take as directed and contact our office if you believe you are having problem(s) with the medication(s).

## 2011-04-04 NOTE — Progress Notes (Signed)
  Subjective:    Patient ID: Sheila Hall, female    DOB: 10/08/1950, 60 y.o.   MRN: [id number]  HPI  complains of rash associated with itch Precipitated by outdoor exposure - contact with poison oak Hx same Not improved with OTC calamine  PMH reviewed  Review of Systems  Constitutional: Negative for fever.  Eyes: Negative for pain.  Respiratory: Negative for shortness of breath.   Neurological: Negative for headaches.       Objective:   Physical Exam BP 118/72  Pulse 65  Temp(Src) 98.6 F (37 C) (Oral)  Ht 5\' 8"  (1.727 m)  SpO2 95%  Constitutional: She is oriented to person, place, and time. She appears well-developed and well-nourished. No distress.  Eyes: Conjunctivae and EOM are normal. Pupils are equal, round, and reactive to light. No scleral icterus or conjunctival injection.  Cardiovascular: Normal rate, regular rhythm and normal heart sounds.  No murmur heard. No BLE edema. Pulmonary/Chest: Effort normal and breath sounds normal. No respiratory distress. She has no wheezes.  Skin: Contact dermatitis changes L inner thigh - also lesser involvement on R hand  Psychiatric: She has a normal mood and affect. Her behavior is normal. Judgment and thought content normal.        Assessment & Plan:  Poison oak dermatitis - IM steroids - also pred pak if needed (hx same, no refills left) - renew antihist and tx with topical steroids for itch symptoms - advised on laundry/cleaning now of all household contacts and avoidance of contact in future

## 2011-07-08 ENCOUNTER — Other Ambulatory Visit (INDEPENDENT_AMBULATORY_CARE_PROVIDER_SITE_OTHER): Payer: BC Managed Care – PPO

## 2011-07-08 ENCOUNTER — Other Ambulatory Visit: Payer: Self-pay | Admitting: Internal Medicine

## 2011-07-08 DIAGNOSIS — Z0000 Encounter for general adult medical examination without abnormal findings: Secondary | ICD-10-CM

## 2011-07-08 LAB — LDL CHOLESTEROL, DIRECT: Direct LDL: 136.9 mg/dL

## 2011-07-08 LAB — CBC WITH DIFFERENTIAL/PLATELET
Basophils Absolute: 0 10*3/uL (ref 0.0–0.1)
Eosinophils Absolute: 0.2 10*3/uL (ref 0.0–0.7)
Hemoglobin: 14.2 g/dL (ref 12.0–15.0)
Lymphocytes Relative: 34.9 % (ref 12.0–46.0)
Lymphs Abs: 2.6 10*3/uL (ref 0.7–4.0)
MCHC: 34.2 g/dL (ref 30.0–36.0)
Neutro Abs: 4 10*3/uL (ref 1.4–7.7)
Platelets: 428 10*3/uL — ABNORMAL HIGH (ref 150.0–400.0)
RDW: 13.2 % (ref 11.5–14.6)

## 2011-07-08 LAB — BASIC METABOLIC PANEL
BUN: 17 mg/dL (ref 6–23)
Calcium: 9.2 mg/dL (ref 8.4–10.5)
Creatinine, Ser: 0.8 mg/dL (ref 0.4–1.2)
GFR: 77.28 mL/min (ref >60.00)
Glucose, Bld: 93 mg/dL (ref 70–99)
Potassium: 4.2 mEq/L (ref 3.5–5.1)

## 2011-07-08 LAB — HEPATIC FUNCTION PANEL
AST: 25 U/L (ref 0–37)
Albumin: 4.1 g/dL (ref 3.5–5.2)
Total Bilirubin: 0.4 mg/dL (ref 0.3–1.2)

## 2011-07-08 LAB — TSH: TSH: 1.63 u[IU]/mL (ref 0.35–5.50)

## 2011-07-08 LAB — LIPID PANEL
Cholesterol: 216 mg/dL — ABNORMAL HIGH (ref 0–200)
Triglycerides: 162 mg/dL — ABNORMAL HIGH (ref 0.0–149.0)
VLDL: 32.4 mg/dL (ref 0.0–40.0)

## 2011-07-12 ENCOUNTER — Ambulatory Visit: Payer: BC Managed Care – PPO | Admitting: Internal Medicine

## 2011-07-15 ENCOUNTER — Encounter: Payer: Self-pay | Admitting: Internal Medicine

## 2011-07-15 ENCOUNTER — Ambulatory Visit (INDEPENDENT_AMBULATORY_CARE_PROVIDER_SITE_OTHER): Payer: BC Managed Care – PPO | Admitting: Internal Medicine

## 2011-07-15 DIAGNOSIS — Z0000 Encounter for general adult medical examination without abnormal findings: Secondary | ICD-10-CM

## 2011-07-15 DIAGNOSIS — E785 Hyperlipidemia, unspecified: Secondary | ICD-10-CM

## 2011-07-15 DIAGNOSIS — Z136 Encounter for screening for cardiovascular disorders: Secondary | ICD-10-CM

## 2011-07-15 DIAGNOSIS — Z23 Encounter for immunization: Secondary | ICD-10-CM

## 2011-07-15 DIAGNOSIS — D485 Neoplasm of uncertain behavior of skin: Secondary | ICD-10-CM

## 2011-07-15 DIAGNOSIS — L237 Allergic contact dermatitis due to plants, except food: Secondary | ICD-10-CM

## 2011-07-15 MED ORDER — TRAMADOL HCL 50 MG PO TABS: 50.0000 mg | ORAL_TABLET | Freq: Two times a day (BID) | ORAL | Status: DC | PRN

## 2011-07-15 MED ORDER — HYDROXYZINE HCL 50 MG PO TABS: 50.0000 mg | ORAL_TABLET | Freq: Every evening | ORAL | Status: DC | PRN

## 2011-07-15 MED ORDER — ALBUTEROL SULFATE HFA 108 (90 BASE) MCG/ACT IN AERS: 2.0000 | INHALATION_SPRAY | RESPIRATORY_TRACT | Status: DC | PRN

## 2011-07-15 MED ORDER — ESTRADIOL 1 MG PO TABS: 0.5000 mg | ORAL_TABLET | Freq: Every day | ORAL | Status: DC

## 2011-07-15 MED ORDER — OMEPRAZOLE 40 MG PO CPDR: 40.0000 mg | DELAYED_RELEASE_CAPSULE | Freq: Every day | ORAL | Status: DC

## 2011-07-15 NOTE — Assessment & Plan Note (Signed)
On diet Wt loss 

## 2011-07-15 NOTE — Progress Notes (Signed)
  Subjective:    Patient ID: Sheila Hall, female    DOB: March 28, 1951, 60 y.o.   MRN: [id number]  HPI  The patient is here for a wellness exam. The patient has been doing well overall without major physical or psychological issues going on lately. The patient needs to address  chronic hypertension that has been well controlled with medicines; to address chronic  hyperlipidemia controlled with medicines as well; and to address type 2 chronic diabetes, controlled with medical treatment and diet.   Review of Systems  Constitutional: Negative for chills, activity change, appetite change, fatigue and unexpected weight change.  HENT: Negative for congestion, mouth sores and sinus pressure.   Eyes: Negative for visual disturbance.  Respiratory: Negative for cough and chest tightness.   Gastrointestinal: Negative for nausea and abdominal pain.  Genitourinary: Negative for frequency, difficulty urinating and vaginal pain.  Musculoskeletal: Negative for back pain and gait problem.  Skin: Negative for pallor and rash.  Neurological: Negative for dizziness, tremors, weakness, numbness and headaches.  Psychiatric/Behavioral: Negative for confusion and sleep disturbance.       Objective:   Physical Exam  Constitutional: She appears well-developed and well-nourished. No distress.  HENT:  Head: Normocephalic.  Right Ear: External ear normal.  Left Ear: External ear normal.  Nose: Nose normal.  Mouth/Throat: Oropharynx is clear and moist.  Eyes: Conjunctivae are normal. Pupils are equal, round, and reactive to light. Right eye exhibits no discharge. Left eye exhibits no discharge.  Neck: Normal range of motion. Neck supple. No JVD present. No tracheal deviation present. No thyromegaly present.  Cardiovascular: Normal rate, regular rhythm and normal heart sounds.   Pulmonary/Chest: No stridor. No respiratory distress. She has no wheezes.  Abdominal: Soft. Bowel sounds are normal. She exhibits  no distension and no mass. There is no tenderness. There is no rebound and no guarding.  Musculoskeletal: She exhibits no edema and no tenderness.  Lymphadenopathy:    She has no cervical adenopathy.  Neurological: She displays normal reflexes. No cranial nerve deficit. She exhibits normal muscle tone. Coordination normal.  Skin: No rash noted. No erythema.  Psychiatric: She has a normal mood and affect. Her behavior is normal. Judgment and thought content normal.  Moles - B chest sides, on L labia Wt Readings from Last 3 Encounters:  07/15/11 198 lb (89.812 kg)  11/21/10 191 lb (86.637 kg)  02/15/10 186 lb 12 oz (84.709 kg)    Lab Results  Component Value Date   WBC 7.6 07/08/2011   HGB 14.2 07/08/2011   HCT 41.5 07/08/2011   PLT 428.0* 07/08/2011   GLUCOSE 93 07/08/2011   CHOL 216* 07/08/2011   TRIG 162.0* 07/08/2011   HDL 59.00 07/08/2011   LDLDIRECT 136.9 07/08/2011   LDLCALC 116* 07/06/2008   ALT 23 07/08/2011   AST 25 07/08/2011   NA 140 07/08/2011   K 4.2 07/08/2011   CL 105 07/08/2011   CREATININE 0.8 07/08/2011   BUN 17 07/08/2011   CO2 26 07/08/2011   TSH 1.63 07/08/2011   HGBA1C 5.6 08/05/2007          Assessment & Plan:

## 2011-07-15 NOTE — Assessment & Plan Note (Signed)
We discussed age appropriate health related issues, including available/recomended screening tests and vaccinations. We discussed a need for adhering to healthy diet and exercise. Labs/EKG were reviewed/ordered. All questions were answered. Loose wt  

## 2011-07-15 NOTE — Assessment & Plan Note (Signed)
Skin bx 

## 2011-07-29 ENCOUNTER — Ambulatory Visit (INDEPENDENT_AMBULATORY_CARE_PROVIDER_SITE_OTHER): Payer: BC Managed Care – PPO | Admitting: Internal Medicine

## 2011-07-29 VITALS — BP 144/84 | HR 72 | Temp 98.0°F | Wt 199.0 lb

## 2011-07-29 DIAGNOSIS — D489 Neoplasm of uncertain behavior, unspecified: Secondary | ICD-10-CM

## 2011-07-29 DIAGNOSIS — D485 Neoplasm of uncertain behavior of skin: Secondary | ICD-10-CM

## 2011-07-29 NOTE — Progress Notes (Signed)
  Subjective:    Patient ID: Sheila Hall, female    DOB: 06/25/1951, 60 y.o.   MRN: [id number]  HPI   Skin bx    Review of Systems     Objective:   Physical Exam    Procedure Note :     Procedure :  Skin biopsy   Indication:  Changing mole (s ),  Suspicious lesion(s)   Risks including unsuccessful procedure , bleeding, infection, bruising, scar, a need for another complete procedure and others were explained to the patient in detail as well as the benefits. Informed consent was obtained and signed.   The patient was placed in a decubitus position.  Lesion #1 on  R lat breast  Measuring 4x3 mm   Skin over lesion #1  was prepped with Betadine and alcohol  and anesthetized with 1 cc of 2% lidocaine and epinephrine, using a 25-gauge 1 inch needle.  Shave biopsy with a sterile Dermablade was carried out in the usual fashion. Hyfrecator was used to destroy the rest of the lesion potentially left behind and for hemostasis. Band-Aid was applied with antibiotic ointment.    Lesion #2 on   L lat breast  measuring 6x2  mm   Skin over lesion #2  was prepped with Betadine and alcohol  and anesthetized with 1 cc of 2% lidocaine and epinephrine, using a 25-gauge 1 inch needle.  Shave biopsy with a sterile Dermablade was carried out in the usual fashion. Hyfrecator was used to destroy the rest of the lesion potentially left behind and for hemostasis. Band-Aid was applied with antibiotic ointment. Discarded  Lesion #3 on L labia   measuring  6x2 mm   Skin over lesion #3  was prepped with Betadine and alcohol  and anesthetized with 1 cc of 2% lidocaine and epinephrine, using a 25-gauge 1 inch needle.  Shave biopsy with a sterile Dermablade was carried out in the usual fashion. Hyfrecator was used to destroy the rest of the lesion potentially left behind and for hemostasis. Band-Aid was not applied - gauze  with antibiotic ointment.   Tolerated well. Complications none.      Postprocedure instructions :    A Band-Aid should be  changed twice daily. You can take a shower tomorrow.  Keep the wounds clean. You can wash them with liquid soap and water. Pat dry with gauze or a Kleenex tissue  Before applying antibiotic ointment and a Band-Aid.   You need to report immediately  if fever, chills or any signs of infection develop.    The biopsy results should be available in 1 -2 weeks.       Assessment & Plan:

## 2011-07-30 NOTE — Assessment & Plan Note (Signed)
See bx °

## 2011-07-31 ENCOUNTER — Telehealth: Payer: Self-pay | Admitting: Internal Medicine

## 2011-07-31 NOTE — Telephone Encounter (Signed)
Sheila Hall, please, inform patient that her bx was benign Thx

## 2011-08-04 ENCOUNTER — Telehealth: Payer: Self-pay | Admitting: Internal Medicine

## 2011-08-04 NOTE — Telephone Encounter (Signed)
Sheila Hall, please, inform patient that her skin bx were benign Thx

## 2011-08-05 NOTE — Telephone Encounter (Signed)
Left detailed mess informing pt of below.  

## 2011-08-06 ENCOUNTER — Telehealth: Payer: Self-pay | Admitting: Internal Medicine

## 2011-08-06 NOTE — Telephone Encounter (Signed)
Sheila Hall, please, inform patient that ski bx was benign - good Thx

## 2011-08-06 NOTE — Telephone Encounter (Signed)
Done on 08-05-11.

## 2011-09-12 ENCOUNTER — Other Ambulatory Visit: Payer: Self-pay | Admitting: *Deleted

## 2011-09-12 DIAGNOSIS — L237 Allergic contact dermatitis due to plants, except food: Secondary | ICD-10-CM

## 2011-09-12 MED ORDER — HYDROXYZINE HCL 50 MG PO TABS: 50.0000 mg | ORAL_TABLET | Freq: Every evening | ORAL | Status: DC | PRN

## 2011-09-12 MED ORDER — OMEPRAZOLE 40 MG PO CPDR: 40.0000 mg | DELAYED_RELEASE_CAPSULE | Freq: Every day | ORAL | Status: DC

## 2011-09-12 NOTE — Telephone Encounter (Signed)
Pt needs rx resent for Omeprazole and Hydroxyzine to [facility]. Rx was sent previously for 30 day supply; she needs rx sent for 90 day supply. Rx's sent, pt informed.

## 2011-10-04 ENCOUNTER — Telehealth: Payer: Self-pay | Admitting: *Deleted

## 2011-10-04 MED ORDER — CIPROFLOXACIN HCL 500 MG PO TABS: 500.0000 mg | ORAL_TABLET | Freq: Two times a day (BID) | ORAL | Status: DC

## 2011-10-04 NOTE — Telephone Encounter (Signed)
Rf req for Cipro 500 mg 1 po bid # 20. Last Filled 04-09-11. Ok to Rf?

## 2011-10-04 NOTE — Telephone Encounter (Signed)
OK to fill this prescription with additional refills x0 Thank you!  

## 2011-12-31 ENCOUNTER — Telehealth: Payer: Self-pay | Admitting: Internal Medicine

## 2011-12-31 MED ORDER — ZOLPIDEM TARTRATE 5 MG PO TABS: 5.0000 mg | ORAL_TABLET | Freq: Every evening | ORAL | Status: DC | PRN

## 2011-12-31 NOTE — Telephone Encounter (Signed)
Needs ambien 5 mg Will order

## 2012-01-28 ENCOUNTER — Encounter: Payer: Self-pay | Admitting: Internal Medicine

## 2012-02-21 ENCOUNTER — Encounter: Payer: Self-pay | Admitting: Endocrinology

## 2012-02-21 ENCOUNTER — Ambulatory Visit (INDEPENDENT_AMBULATORY_CARE_PROVIDER_SITE_OTHER): Payer: BC Managed Care – PPO | Admitting: Endocrinology

## 2012-02-21 VITALS — BP 122/72 | HR 62 | Temp 97.7°F | Ht 68.0 in | Wt 196.0 lb

## 2012-02-21 DIAGNOSIS — R21 Rash and other nonspecific skin eruption: Secondary | ICD-10-CM

## 2012-02-21 DIAGNOSIS — R229 Localized swelling, mass and lump, unspecified: Secondary | ICD-10-CM

## 2012-02-21 MED ORDER — CEFUROXIME AXETIL 250 MG PO TABS: 250.0000 mg | ORAL_TABLET | Freq: Two times a day (BID) | ORAL | Status: AC

## 2012-02-21 NOTE — Progress Notes (Signed)
Subjective:    Patient ID: Sheila Hall, female    DOB: 1951/02/07, 61 y.o.   MRN: [id number]  HPI Pt state few days of slight sort throat, and assoc pain at the left ear. Past Medical History  Diagnosis Date  . ASTHMA 08/12/2007  . DISORDER, CERVICAL DISC W/MYELOPATHY 05/19/2007  . Edema 03/10/2009  . GERD 04/13/2007  . Headache 02/15/2010  . HYPERLIPIDEMIA 04/13/2007  . OSTEOPENIA 04/13/2007  . OSTEOPOROSIS 05/23/2007  . SYNCOPE 02/15/2010    Past Surgical History  Procedure Date  . Abdominal hysterectomy   . Appendectomy   . Cholecystectomy   . Tubal ligation   . Cervical laminectomy     History   Social History  . Marital Status: Married    Spouse Name: N/A    Number of Children: N/A  . Years of Education: N/A   Occupational History  . Not on file.   Social History Main Topics  . Smoking status: Never Smoker   . Smokeless tobacco: Not on file  . Alcohol Use: Not on file  . Drug Use: Not on file  . Sexually Active: Not on file   Other Topics Concern  . Not on file   Social History Narrative  . No narrative on file    Current Outpatient Prescriptions on File Prior to Visit  Medication Sig Dispense Refill  . aspirin 81 MG tablet Take 81 mg by mouth daily.        . Cholecalciferol (VITAMIN D3) 1000 UNITS CAPS Take by mouth daily.        Marland Kitchen estradiol (ESTRACE) 1 MG tablet Take 0.5 tablets (0.5 mg total) by mouth daily.  90 tablet  3  . hydrOXYzine (ATARAX/VISTARIL) 50 MG tablet Take 1 tablet (50 mg total) by mouth at bedtime as needed for itching or anxiety.  90 tablet  2  . omeprazole (PRILOSEC) 40 MG capsule Take 1 capsule (40 mg total) by mouth daily.  90 capsule  2  . traMADol (ULTRAM) 50 MG tablet Take 1 tablet (50 mg total) by mouth 2 (two) times daily as needed.  120 tablet  3  . triamcinolone (KENALOG) 0.1 % ointment Apply topically 2 (two) times daily as needed (for affected skin).  30 g  0  . zolpidem (AMBIEN) 5 MG tablet Take 1 tablet (5 mg total) by  mouth at bedtime as needed for sleep.  90 tablet  1  . albuterol (PROAIR HFA) 108 (90 BASE) MCG/ACT inhaler Inhale 2 puffs into the lungs every 4 (four) hours as needed.  1 Inhaler  6  . fexofenadine (ALLEGRA) 180 MG tablet Take 180 mg by mouth daily as needed.          Allergies  Allergen Reactions  . Codeine Sulfate     REACTION: nausea    Family History  Problem Relation Age of Onset  . Colon cancer Father 3  . Coronary artery disease Other     BP 122/72  Pulse 62  Temp 97.7 F (36.5 C) (Oral)  Ht 5\' 8"  (1.727 m)  Wt 196 lb (88.905 kg)  BMI 29.80 kg/m2  SpO2 97%  Review of Systems Denies fever and nasal congestion.      Objective:   Physical Exam VITAL SIGNS:  See vs page GENERAL: no distress head: no deformity eyes: no periorbital swelling, no proptosis external nose and ears are normal mouth: no lesion seen Both eac's and tm's are normal Left forearm: 1 cm flat shiny shin patch  Assessment & Plan:  URI.  New Skin rash, new

## 2012-02-21 NOTE — Patient Instructions (Addendum)
i have sent a prescription to your pharmacy, for an antibiotic pill I hope you feel better soon.  If you don't feel better by next week, please call back.   Refer to a dermatology specialist.  you will receive a phone call, about a day and time for an appointment.

## 2012-02-26 ENCOUNTER — Telehealth: Payer: Self-pay | Admitting: Internal Medicine

## 2012-02-26 NOTE — Telephone Encounter (Signed)
Caller: Verna/Patient; PCP: Sonda Primes; CB#: [phone]; Call regarding: Is at [facility]. Has diverticulosis, abd pain. Requests an abx. Pt advises Dr. Posey Rea routinely calls in abx for diverticulosis flairups. Dr. Paulette Blanch is out of the ofc this week. To UC for eval. She voices understanding.

## 2012-04-16 ENCOUNTER — Ambulatory Visit: Payer: BC Managed Care – PPO | Admitting: Internal Medicine

## 2012-04-27 ENCOUNTER — Encounter: Payer: Self-pay | Admitting: Internal Medicine

## 2012-04-27 ENCOUNTER — Other Ambulatory Visit: Payer: Self-pay | Admitting: *Deleted

## 2012-04-27 ENCOUNTER — Other Ambulatory Visit (INDEPENDENT_AMBULATORY_CARE_PROVIDER_SITE_OTHER): Payer: BC Managed Care – PPO

## 2012-04-27 ENCOUNTER — Ambulatory Visit (INDEPENDENT_AMBULATORY_CARE_PROVIDER_SITE_OTHER): Payer: BC Managed Care – PPO | Admitting: Internal Medicine

## 2012-04-27 VITALS — BP 120/78 | HR 80 | Temp 98.6°F | Resp 16 | Wt 195.0 lb

## 2012-04-27 DIAGNOSIS — R1013 Epigastric pain: Secondary | ICD-10-CM

## 2012-04-27 DIAGNOSIS — R079 Chest pain, unspecified: Secondary | ICD-10-CM

## 2012-04-27 DIAGNOSIS — R0789 Other chest pain: Secondary | ICD-10-CM

## 2012-04-27 LAB — CBC WITH DIFFERENTIAL/PLATELET
Basophils Relative: 0.4 % (ref 0.0–3.0)
Eosinophils Relative: 2.6 % (ref 0.0–5.0)
HCT: 42 % (ref 36.0–46.0)
Hemoglobin: 13.7 g/dL (ref 12.0–15.0)
Lymphs Abs: 3.2 10*3/uL (ref 0.7–4.0)
Monocytes Relative: 9.3 % (ref 3.0–12.0)
Platelets: 432 10*3/uL — ABNORMAL HIGH (ref 150.0–400.0)
RBC: 4.49 Mil/uL (ref 3.87–5.11)
WBC: 8.4 10*3/uL (ref 4.5–10.5)

## 2012-04-27 LAB — URINALYSIS
Bilirubin Urine: NEGATIVE
Ketones, ur: NEGATIVE
Leukocytes, UA: NEGATIVE
Total Protein, Urine: NEGATIVE
pH: 6 (ref 5.0–8.0)

## 2012-04-27 LAB — H. PYLORI ANTIBODY, IGG: H Pylori IgG: NEGATIVE

## 2012-04-27 MED ORDER — RANITIDINE HCL 150 MG PO TABS: 300.0000 mg | ORAL_TABLET | Freq: Every day | ORAL | Status: DC

## 2012-04-27 MED ORDER — CILIDINIUM-CHLORDIAZEPOXIDE 2.5-5 MG PO CAPS: 1.0000 | ORAL_CAPSULE | Freq: Three times a day (TID) | ORAL | Status: DC | PRN

## 2012-04-27 MED ORDER — NITROGLYCERIN 0.4 MG SL SUBL: 0.4000 mg | SUBLINGUAL_TABLET | SUBLINGUAL | Status: DC | PRN

## 2012-04-27 NOTE — Progress Notes (Signed)
Subjective:    Patient ID: Sheila Hall, female    DOB: Jun 03, 1951, 61 y.o.   MRN: [id number]  Chest Pain  This is a new problem. The current episode started 1 to 4 weeks ago. The onset quality is sudden. The problem occurs intermittently. The problem has been waxing and waning. The pain is present in the substernal region and epigastric region. The pain is at a severity of 8/10. The pain is severe. The quality of the pain is described as burning, heavy, sharp, pressure, squeezing and tightness. The pain radiates to the epigastrium and mid back. Associated symptoms include abdominal pain. Pertinent negatives include no back pain, claudication, cough, diaphoresis, dizziness, headaches, hemoptysis, nausea, numbness, orthopnea, PND, sputum production, vomiting or weakness. The pain is aggravated by breathing and emotional upset. She has tried analgesics, antacids, NSAIDs and rest for the symptoms. The treatment provided mild relief. Risk factors include stress.  Her family medical history is significant for CAD in family.     BP Readings from Last 3 Encounters:  04/27/12 120/78  02/21/12 122/72  07/29/11 144/84   Wt Readings from Last 3 Encounters:  04/27/12 195 lb (88.451 kg)  02/21/12 196 lb (88.905 kg)  07/29/11 199 lb (90.266 kg)       Review of Systems  Constitutional: Negative for chills, diaphoresis, activity change, appetite change, fatigue and unexpected weight change.  HENT: Negative for congestion, mouth sores and sinus pressure.   Eyes: Negative for visual disturbance.  Respiratory: Negative for cough, hemoptysis, sputum production, chest tightness and wheezing.   Cardiovascular: Positive for chest pain. Negative for orthopnea, claudication and PND.  Gastrointestinal: Positive for abdominal pain. Negative for nausea and vomiting.  Genitourinary: Negative for frequency, difficulty urinating and vaginal pain.  Musculoskeletal: Negative for back pain and gait problem.    Skin: Negative for pallor and rash.  Neurological: Negative for dizziness, tremors, weakness, numbness and headaches.  Psychiatric/Behavioral: Negative for confusion and disturbed wake/sleep cycle.       Objective:   Physical Exam  Constitutional: She appears well-developed. No distress.       Obese   HENT:  Head: Normocephalic.  Right Ear: External ear normal.  Left Ear: External ear normal.  Nose: Nose normal.  Mouth/Throat: Oropharynx is clear and moist.  Eyes: Conjunctivae are normal. Pupils are equal, round, and reactive to light. Right eye exhibits no discharge. Left eye exhibits no discharge.  Neck: Normal range of motion. Neck supple. No JVD present. No tracheal deviation present. No thyromegaly present.  Cardiovascular: Normal rate, regular rhythm and normal heart sounds.   Pulmonary/Chest: No stridor. No respiratory distress. She has no wheezes. She exhibits no tenderness.  Abdominal: Soft. Bowel sounds are normal. She exhibits no distension and no mass. There is tenderness. There is no rebound and no guarding.       epig is tender  Musculoskeletal: She exhibits no edema and no tenderness.  Lymphadenopathy:    She has no cervical adenopathy.  Neurological: She displays normal reflexes. No cranial nerve deficit. She exhibits normal muscle tone. Coordination normal.  Skin: No rash noted. No erythema.  Psychiatric: She has a normal mood and affect. Her behavior is normal. Judgment and thought content normal.  Moles - B chest sides   Lab Results  Component Value Date   WBC 7.6 07/08/2011   HGB 14.2 07/08/2011   HCT 41.5 07/08/2011   PLT 428.0* 07/08/2011   GLUCOSE 93 07/08/2011   CHOL 216* 07/08/2011   TRIG  162.0* 07/08/2011   HDL 59.00 07/08/2011   LDLDIRECT 136.9 07/08/2011   LDLCALC 116* 07/06/2008   ALT 23 07/08/2011   AST 25 07/08/2011   NA 140 07/08/2011   K 4.2 07/08/2011   CL 105 07/08/2011   CREATININE 0.8 07/08/2011   BUN 17 07/08/2011   CO2 26  07/08/2011   TSH 1.63 07/08/2011   HGBA1C 5.6 08/05/2007          Assessment & Plan:

## 2012-04-27 NOTE — Patient Instructions (Signed)
Call me in 2 days To ER if worse

## 2012-04-27 NOTE — Assessment & Plan Note (Addendum)
Stress ECHO - dobutamine CT if needed ASA NTG CKMB

## 2012-04-27 NOTE — Assessment & Plan Note (Addendum)
Labs Zantac 300 mg at hs Dexilant 1 bid in place of Prilosec GI cons and CT if needed

## 2012-04-28 LAB — SEDIMENTATION RATE: Sed Rate: 17 mm/hr (ref 0–22)

## 2012-04-28 LAB — HEPATIC FUNCTION PANEL
ALT: 27 U/L (ref 0–35)
AST: 24 U/L (ref 0–37)
Total Bilirubin: 0.6 mg/dL (ref 0.3–1.2)
Total Protein: 7 g/dL (ref 6.0–8.3)

## 2012-04-30 ENCOUNTER — Inpatient Hospital Stay (HOSPITAL_COMMUNITY)
Admission: EM | Admit: 2012-04-30 | Discharge: 2012-05-02 | DRG: 182 | Disposition: A | Discharge status: Home / Self Care | Payer: BC Managed Care – PPO | Attending: Internal Medicine | Admitting: Internal Medicine

## 2012-04-30 ENCOUNTER — Encounter: Payer: Self-pay | Admitting: Internal Medicine

## 2012-04-30 ENCOUNTER — Ambulatory Visit (INDEPENDENT_AMBULATORY_CARE_PROVIDER_SITE_OTHER): Payer: BC Managed Care – PPO | Admitting: Internal Medicine

## 2012-04-30 ENCOUNTER — Encounter (HOSPITAL_COMMUNITY): Payer: Self-pay | Admitting: Emergency Medicine

## 2012-04-30 ENCOUNTER — Other Ambulatory Visit (INDEPENDENT_AMBULATORY_CARE_PROVIDER_SITE_OTHER): Payer: BC Managed Care – PPO

## 2012-04-30 ENCOUNTER — Emergency Department (HOSPITAL_COMMUNITY): Payer: BC Managed Care – PPO

## 2012-04-30 ENCOUNTER — Ambulatory Visit (INDEPENDENT_AMBULATORY_CARE_PROVIDER_SITE_OTHER)
Admission: RE | Admit: 2012-04-30 | Discharge: 2012-04-30 | Disposition: A | Discharge status: Home / Self Care | Payer: BC Managed Care – PPO | Source: Ambulatory Visit | Attending: Internal Medicine | Admitting: Internal Medicine

## 2012-04-30 VITALS — BP 118/64 | HR 93 | Temp 98.4°F | Wt 193.0 lb

## 2012-04-30 DIAGNOSIS — R55 Syncope and collapse: Secondary | ICD-10-CM

## 2012-04-30 DIAGNOSIS — R10813 Right lower quadrant abdominal tenderness: Secondary | ICD-10-CM

## 2012-04-30 DIAGNOSIS — M5000 Cervical disc disorder with myelopathy, unspecified cervical region: Secondary | ICD-10-CM

## 2012-04-30 DIAGNOSIS — Z8249 Family history of ischemic heart disease and other diseases of the circulatory system: Secondary | ICD-10-CM

## 2012-04-30 DIAGNOSIS — R319 Hematuria, unspecified: Secondary | ICD-10-CM

## 2012-04-30 DIAGNOSIS — R1013 Epigastric pain: Secondary | ICD-10-CM

## 2012-04-30 DIAGNOSIS — R51 Headache: Secondary | ICD-10-CM

## 2012-04-30 DIAGNOSIS — R42 Dizziness and giddiness: Secondary | ICD-10-CM

## 2012-04-30 DIAGNOSIS — M7710 Lateral epicondylitis, unspecified elbow: Secondary | ICD-10-CM

## 2012-04-30 DIAGNOSIS — Z800 Family history of malignant neoplasm of digestive organs: Secondary | ICD-10-CM

## 2012-04-30 DIAGNOSIS — M79609 Pain in unspecified limb: Secondary | ICD-10-CM

## 2012-04-30 DIAGNOSIS — K5792 Diverticulitis of intestine, part unspecified, without perforation or abscess without bleeding: Secondary | ICD-10-CM

## 2012-04-30 DIAGNOSIS — E871 Hypo-osmolality and hyponatremia: Secondary | ICD-10-CM

## 2012-04-30 DIAGNOSIS — E785 Hyperlipidemia, unspecified: Secondary | ICD-10-CM

## 2012-04-30 DIAGNOSIS — R609 Edema, unspecified: Secondary | ICD-10-CM

## 2012-04-30 DIAGNOSIS — Z0000 Encounter for general adult medical examination without abnormal findings: Secondary | ICD-10-CM

## 2012-04-30 DIAGNOSIS — R739 Hyperglycemia, unspecified: Secondary | ICD-10-CM

## 2012-04-30 DIAGNOSIS — L259 Unspecified contact dermatitis, unspecified cause: Secondary | ICD-10-CM

## 2012-04-30 DIAGNOSIS — Z860100 Personal history of colon polyps, unspecified: Secondary | ICD-10-CM

## 2012-04-30 DIAGNOSIS — K5732 Diverticulitis of large intestine without perforation or abscess without bleeding: Principal | ICD-10-CM | POA: Diagnosis present

## 2012-04-30 DIAGNOSIS — R229 Localized swelling, mass and lump, unspecified: Secondary | ICD-10-CM

## 2012-04-30 DIAGNOSIS — Z885 Allergy status to narcotic agent status: Secondary | ICD-10-CM

## 2012-04-30 DIAGNOSIS — R0789 Other chest pain: Secondary | ICD-10-CM

## 2012-04-30 DIAGNOSIS — M810 Age-related osteoporosis without current pathological fracture: Secondary | ICD-10-CM

## 2012-04-30 DIAGNOSIS — Z90710 Acquired absence of both cervix and uterus: Secondary | ICD-10-CM

## 2012-04-30 DIAGNOSIS — K56609 Unspecified intestinal obstruction, unspecified as to partial versus complete obstruction: Secondary | ICD-10-CM | POA: Diagnosis present

## 2012-04-30 DIAGNOSIS — J45909 Unspecified asthma, uncomplicated: Secondary | ICD-10-CM

## 2012-04-30 DIAGNOSIS — D72829 Elevated white blood cell count, unspecified: Secondary | ICD-10-CM

## 2012-04-30 DIAGNOSIS — S139XXA Sprain of joints and ligaments of unspecified parts of neck, initial encounter: Secondary | ICD-10-CM

## 2012-04-30 DIAGNOSIS — G43909 Migraine, unspecified, not intractable, without status migrainosus: Secondary | ICD-10-CM

## 2012-04-30 DIAGNOSIS — K219 Gastro-esophageal reflux disease without esophagitis: Secondary | ICD-10-CM

## 2012-04-30 DIAGNOSIS — K5730 Diverticulosis of large intestine without perforation or abscess without bleeding: Secondary | ICD-10-CM

## 2012-04-30 DIAGNOSIS — R748 Abnormal levels of other serum enzymes: Secondary | ICD-10-CM

## 2012-04-30 DIAGNOSIS — R32 Unspecified urinary incontinence: Secondary | ICD-10-CM

## 2012-04-30 DIAGNOSIS — R109 Unspecified abdominal pain: Secondary | ICD-10-CM

## 2012-04-30 DIAGNOSIS — D485 Neoplasm of uncertain behavior of skin: Secondary | ICD-10-CM

## 2012-04-30 LAB — CBC WITH DIFFERENTIAL/PLATELET
Basophils Absolute: 0.1 10*3/uL (ref 0.0–0.1)
Eosinophils Absolute: 0.1 10*3/uL (ref 0.0–0.7)
HCT: 42 % (ref 36.0–46.0)
Hemoglobin: 14.3 g/dL (ref 12.0–15.0)
Lymphocytes Relative: 13.8 % (ref 12.0–46.0)
Lymphs Abs: 3 10*3/uL (ref 0.7–4.0)
MCH: 31 pg (ref 26.0–34.0)
MCHC: 32.6 g/dL (ref 30.0–36.0)
Monocytes Relative: 9 % (ref 3–12)
Neutro Abs: 14.8 10*3/uL — ABNORMAL HIGH (ref 1.7–7.7)
Neutrophils Relative %: 78 % — ABNORMAL HIGH (ref 43.0–77.0)
Neutrophils Relative %: 75 % (ref 43–77)
Platelets: 435 10*3/uL — ABNORMAL HIGH (ref 150.0–400.0)
RBC: 4.62 MIL/uL (ref 3.87–5.11)
RDW: 13.3 % (ref 11.5–14.6)

## 2012-04-30 LAB — COMPREHENSIVE METABOLIC PANEL
ALT: 27 U/L (ref 0–35)
AST: 31 U/L (ref 0–37)
Albumin: 4.3 g/dL (ref 3.5–5.2)
Alkaline Phosphatase: 71 U/L (ref 39–117)
BUN: 13 mg/dL (ref 6–23)
CO2: 26 mEq/L (ref 19–32)
Calcium: 9 mg/dL (ref 8.4–10.5)
Chloride: 100 mEq/L (ref 96–112)
Creatinine, Ser: 0.7 mg/dL (ref 0.4–1.2)
GFR: 88.46 mL/min (ref >60.00)
Glucose, Bld: 110 mg/dL — ABNORMAL HIGH (ref 70–99)
Potassium: 4.8 mEq/L (ref 3.5–5.1)
Sodium: 133 mEq/L — ABNORMAL LOW (ref 135–145)
Total Bilirubin: 1.3 mg/dL — ABNORMAL HIGH (ref 0.3–1.2)
Total Protein: 7.9 g/dL (ref 6.0–8.3)

## 2012-04-30 LAB — BASIC METABOLIC PANEL
BUN: 13 mg/dL (ref 6–23)
Chloride: 95 mEq/L — ABNORMAL LOW (ref 96–112)
Glucose, Bld: 117 mg/dL — ABNORMAL HIGH (ref 70–99)
Potassium: 4 mEq/L (ref 3.5–5.1)

## 2012-04-30 LAB — AMYLASE: Amylase: 40 U/L (ref 27–131)

## 2012-04-30 LAB — LIPASE: Lipase: 25 U/L (ref 11.0–59.0)

## 2012-04-30 MED ORDER — HYDROXYZINE HCL 50 MG PO TABS
50.0000 mg | ORAL_TABLET | Freq: Three times a day (TID) | ORAL | Status: DC | PRN
  Administered 2012-05-01 (×2): 50 mg via ORAL
  Filled 2012-04-30 (×2): qty 1

## 2012-04-30 MED ORDER — ONDANSETRON HCL 4 MG/2ML IJ SOLN
4.0000 mg | Freq: Once | INTRAMUSCULAR | Status: DC
  Filled 2012-04-30: qty 2

## 2012-04-30 MED ORDER — CIPROFLOXACIN IN D5W 400 MG/200ML IV SOLN
400.0000 mg | Freq: Once | INTRAVENOUS | Status: AC
  Administered 2012-04-30: 400 mg via INTRAVENOUS

## 2012-04-30 MED ORDER — MORPHINE SULFATE 4 MG/ML IJ SOLN: 4.0000 mg | Freq: Once | INTRAMUSCULAR | Status: DC

## 2012-04-30 MED ORDER — PROMETHAZINE HCL 12.5 MG PO TABS: 12.5000 mg | ORAL_TABLET | Freq: Four times a day (QID) | ORAL | Status: DC | PRN

## 2012-04-30 MED ORDER — OXYCODONE-ACETAMINOPHEN 7.5-325 MG PO TABS: 1.0000 | ORAL_TABLET | ORAL | Status: DC | PRN

## 2012-04-30 MED ORDER — ESTRADIOL 1 MG PO TABS
0.5000 mg | ORAL_TABLET | Freq: Every day | ORAL | Status: DC
  Administered 2012-05-01 – 2012-05-02 (×2): 0.5 mg via ORAL
  Filled 2012-04-30 (×2): qty 0.5

## 2012-04-30 MED ORDER — ALBUTEROL SULFATE HFA 108 (90 BASE) MCG/ACT IN AERS: 2.0000 | INHALATION_SPRAY | RESPIRATORY_TRACT | Status: DC | PRN

## 2012-04-30 MED ORDER — FAMOTIDINE 20 MG PO TABS
20.0000 mg | ORAL_TABLET | Freq: Two times a day (BID) | ORAL | Status: DC
  Administered 2012-04-30 – 2012-05-02 (×4): 20 mg via ORAL
  Filled 2012-04-30 (×5): qty 1

## 2012-04-30 MED ORDER — ZOLPIDEM TARTRATE 5 MG PO TABS
2.5000 mg | ORAL_TABLET | Freq: Every evening | ORAL | Status: DC | PRN
  Administered 2012-04-30 – 2012-05-01 (×2): 2.5 mg via ORAL
  Filled 2012-04-30 (×2): qty 1

## 2012-04-30 MED ORDER — HYDROMORPHONE HCL PF 1 MG/ML IJ SOLN
1.0000 mg | INTRAMUSCULAR | Status: DC | PRN
  Administered 2012-04-30 – 2012-05-01 (×3): 1 mg via INTRAVENOUS
  Filled 2012-04-30 (×3): qty 1

## 2012-04-30 MED ORDER — SODIUM CHLORIDE 0.9 % IV SOLN: INTRAVENOUS | Status: DC

## 2012-04-30 MED ORDER — METRONIDAZOLE IN NACL 5-0.79 MG/ML-% IV SOLN
500.0000 mg | Freq: Three times a day (TID) | INTRAVENOUS | Status: DC
  Administered 2012-05-01 – 2012-05-02 (×4): 500 mg via INTRAVENOUS
  Filled 2012-04-30 (×5): qty 100

## 2012-04-30 MED ORDER — ONDANSETRON HCL 4 MG/2ML IJ SOLN
4.0000 mg | Freq: Once | INTRAMUSCULAR | Status: AC
  Administered 2012-04-30: 4 mg via INTRAVENOUS

## 2012-04-30 MED ORDER — SODIUM CHLORIDE 0.9 % IV SOLN
INTRAVENOUS | Status: DC
  Administered 2012-04-30: 19:00:00 via INTRAVENOUS

## 2012-04-30 MED ORDER — CIPROFLOXACIN IN D5W 400 MG/200ML IV SOLN
400.0000 mg | Freq: Two times a day (BID) | INTRAVENOUS | Status: DC
  Administered 2012-05-01 (×2): 400 mg via INTRAVENOUS
  Filled 2012-04-30 (×4): qty 200

## 2012-04-30 MED ORDER — MORPHINE SULFATE 4 MG/ML IJ SOLN
4.0000 mg | Freq: Once | INTRAMUSCULAR | Status: AC
  Administered 2012-04-30: 4 mg via INTRAVENOUS
  Filled 2012-04-30: qty 1

## 2012-04-30 MED ORDER — IOHEXOL 300 MG/ML  SOLN
100.0000 mL | Freq: Once | INTRAMUSCULAR | Status: AC | PRN
  Administered 2012-04-30: 100 mL via INTRAVENOUS

## 2012-04-30 MED ORDER — KCL IN DEXTROSE-NACL 20-5-0.9 MEQ/L-%-% IV SOLN
INTRAVENOUS | Status: DC
  Administered 2012-04-30 – 2012-05-01 (×2): via INTRAVENOUS
  Filled 2012-04-30 (×5): qty 1000

## 2012-04-30 MED ORDER — METRONIDAZOLE IN NACL 5-0.79 MG/ML-% IV SOLN
500.0000 mg | Freq: Once | INTRAVENOUS | Status: AC
  Administered 2012-04-30: 500 mg via INTRAVENOUS
  Filled 2012-04-30: qty 100

## 2012-04-30 NOTE — Patient Instructions (Signed)

## 2012-04-30 NOTE — ED Notes (Signed)
Patient transported to CT 

## 2012-04-30 NOTE — ED Notes (Signed)
Pt from doctor office right side abdominal pain 10/10 that started at 1030. Doctor lab showed blood in urine and wbc elavted at 20,000.

## 2012-04-30 NOTE — ED Provider Notes (Addendum)
Medical screening examination/treatment/procedure(s) were conducted as a shared visit with non-physician practitioner(s) and myself.  I personally evaluated the patient during the encounter Pt has hx of cholecystectomy, total hysterectomy, appendectomy and diverticulitis.   She c/o rlq pain since this am.  No n/v/d/uti sxs. No fever.  pe + ttp in rlq with guarding.   Will check ct and labs.    Cheri Guppy, MD 04/30/12 1952  Spoke with dr. Conley Rolls.  He will admit for further tx with abxs and analgesics  Cheri Guppy, MD 04/30/12 2128

## 2012-04-30 NOTE — ED Notes (Signed)
Attempt x1 to call report to 5E. States RN will call back.

## 2012-04-30 NOTE — Assessment & Plan Note (Addendum)
I think she has renal colic, she did not want percocet, I will check her labs to look for hepatitis, pancreatitis, blood loss, etc. She will have plain films done today and I have ordered a CT scan as well.  Late note - the lab called and report a WBC count > 20k, she returned to the office and will go to the ER for emergent evaluation of a possible infection

## 2012-04-30 NOTE — Addendum Note (Signed)
Addended by: Etta Grandchild on: 04/30/2012 05:44 PM   Modules accepted: Orders

## 2012-04-30 NOTE — ED Notes (Signed)
Pt reports severe, sharp, constant right-sided abdominal pain starting at 10:30 this morning. States she has hx of diverticulitis, went to gastro dr today and did blood work. States they found blood in her urine and WBC count was elevated and she was told to come to ED. Rates pain 10/10. Denies N/V/D.

## 2012-04-30 NOTE — ED Notes (Signed)
Pt reports pain reduced to 2/10 with husband at bedside. Pt drinking contrast for CT and in no apparent distress at this time.  Pt has call bell in reach and given an emesis basin if needed. Pt denies nausea at this time and has had one cup of contrast down

## 2012-04-30 NOTE — Assessment & Plan Note (Signed)
I think she has a renal stone, will check plain films to look for a stone and have ordered CT w/o contrast as well, I will check her renal function today

## 2012-04-30 NOTE — Progress Notes (Signed)
Subjective:    Patient ID: Sheila Hall, female    DOB: 12-15-1950, 61 y.o.   MRN: [id number]  Abdominal Pain This is a new problem. The current episode started today. The onset quality is sudden. The problem occurs constantly. The problem has been gradually worsening. The pain is located in the RLQ. The pain is at a severity of 7/10. The pain is severe. The quality of the pain is colicky and sharp. Associated symptoms include nausea. Pertinent negatives include no anorexia, arthralgias, belching, constipation, diarrhea, dysuria, fever, flatus, frequency, headaches, hematochezia, hematuria, melena, myalgias, vomiting or weight loss. Nothing aggravates the pain. Relieved by: tramadol. She has tried oral narcotic analgesics for the symptoms. The treatment provided moderate relief. There is no history of irritable bowel syndrome.      Review of Systems  Constitutional: Negative for fever, chills, weight loss, diaphoresis, activity change, appetite change, fatigue and unexpected weight change.  HENT: Negative.   Eyes: Negative.   Respiratory: Negative for cough, chest tightness, shortness of breath, wheezing and stridor.   Cardiovascular: Negative for chest pain, palpitations and leg swelling.  Gastrointestinal: Positive for nausea and abdominal pain. Negative for vomiting, diarrhea, constipation, blood in stool, melena, hematochezia, abdominal distention, anal bleeding, rectal pain, anorexia and flatus.  Genitourinary: Negative for dysuria, urgency, frequency, hematuria, flank pain, decreased urine volume, vaginal bleeding, vaginal discharge, enuresis, difficulty urinating, genital sores, vaginal pain, menstrual problem and pelvic pain.  Musculoskeletal: Negative for myalgias, back pain, joint swelling, arthralgias and gait problem.  Skin: Negative for color change, pallor, rash and wound.  Neurological: Negative.  Negative for headaches.  Hematological: Negative for adenopathy. Does not  bruise/bleed easily.  Psychiatric/Behavioral: Negative.        Objective:   Physical Exam  Vitals reviewed. Constitutional: She is oriented to person, place, and time. Vital signs are normal. She appears well-developed and well-nourished.  Non-toxic appearance. She does not have a sickly appearance. She does not appear ill. She appears distressed (in pain).  HENT:  Head: Normocephalic and atraumatic.  Mouth/Throat: Oropharynx is clear and moist. No oropharyngeal exudate.  Eyes: Conjunctivae are normal. Right eye exhibits no discharge. Left eye exhibits no discharge. No scleral icterus.  Neck: Normal range of motion. Neck supple. No JVD present. No tracheal deviation present. No thyromegaly present.  Cardiovascular: Normal rate, regular rhythm, normal heart sounds and intact distal pulses.  Exam reveals no gallop and no friction rub.   No murmur heard. Pulmonary/Chest: Breath sounds normal. No stridor. No respiratory distress. She has no wheezes. She has no rales. She exhibits no tenderness.  Abdominal: Soft. Normal appearance and bowel sounds are normal. She exhibits no shifting dullness, no distension, no pulsatile liver, no fluid wave, no abdominal bruit, no ascites, no pulsatile midline mass and no mass. There is no hepatosplenomegaly, splenomegaly or hepatomegaly. There is tenderness in the right lower quadrant and epigastric area. There is no rigidity, no rebound, no guarding, no CVA tenderness, no tenderness at McBurney's point and negative Murphy's sign. No hernia. Hernia confirmed negative in the ventral area, confirmed negative in the right inguinal area and confirmed negative in the left inguinal area.  Genitourinary: Rectal exam shows external hemorrhoid. Rectal exam shows no internal hemorrhoid, no fissure, no mass, no tenderness and anal tone normal. Guaiac negative stool.  Musculoskeletal: Normal range of motion. She exhibits no edema and no tenderness.  Lymphadenopathy:    She has  no cervical adenopathy.  Neurological: She is oriented to person, place, and time.  Skin: Skin is warm and dry. No rash noted. She is not diaphoretic. No erythema. No pallor.  Psychiatric: She has a normal mood and affect. Her behavior is normal. Judgment and thought content normal.      Lab Results  Component Value Date   WBC 8.4 04/27/2012   HGB 13.7 04/27/2012   HCT 42.0 04/27/2012   PLT 432.0* 04/27/2012   GLUCOSE 93 07/08/2011   CHOL 216* 07/08/2011   TRIG 162.0* 07/08/2011   HDL 59.00 07/08/2011   LDLDIRECT 136.9 07/08/2011   LDLCALC 116* 07/06/2008   ALT 27 04/27/2012   AST 24 04/27/2012   NA 140 07/08/2011   K 4.2 07/08/2011   CL 105 07/08/2011   CREATININE 0.8 07/08/2011   BUN 17 07/08/2011   CO2 26 07/08/2011   TSH 1.63 07/08/2011   HGBA1C 5.6 08/05/2007  Urine dipstick shows positive for RBC's.  Micro exam: not done.    Assessment & Plan:

## 2012-04-30 NOTE — ED Notes (Signed)
[id number]> Expected date:<BR> Expected time:<BR> Means of arrival:<BR> Comments:<BR> wh

## 2012-04-30 NOTE — ED Provider Notes (Signed)
History     CSN: [id number]  Arrival date & time 04/30/12  1759   First MD Initiated Contact with Patient 04/30/12 1853      Chief Complaint  Patient presents with  . Abdominal Pain  . Abnormal Lab    (Consider location/radiation/quality/duration/timing/severity/associated sxs/prior treatment) Patient is a 61 y.o. female presenting with abdominal pain.  Abdominal Pain The primary symptoms of the illness include abdominal pain. The primary symptoms of the illness do not include fever, nausea, vomiting, diarrhea, dysuria, vaginal discharge or vaginal bleeding. The current episode started more than 2 days ago. The onset of the illness was sudden. The problem has been gradually worsening.  Additional symptoms associated with the illness include anorexia. Symptoms associated with the illness do not include chills.  Pt withright sided abdominal pain for several days. Pt was seen orignally 3 days ago for epigastric pain. Was switched to different anti acid medication. Pain continued, was seen again today, and had labs drawn and CT abdomen outpatient ordered. Was called and told that her WBC is 21 in the office and was told to come to ER. Pt states her pain is in the right abdomen, upper and lower. Hx of cholecystectomy, appendectomy, hysterectomy. Chart review showed elevated WBC, unremarkable electrolytes at doctors office today. Acute abdomen film showed possible SBO. Pt denies fever, nausea, vomiting. No BM since yesterday. Not passing gas. Pain worsened with movement and palpation of abdomen.   Past Medical History  Diagnosis Date  . ASTHMA 08/12/2007  . DISORDER, CERVICAL DISC W/MYELOPATHY 05/19/2007  . Edema 03/10/2009  . GERD 04/13/2007  . Headache 02/15/2010  . HYPERLIPIDEMIA 04/13/2007  . OSTEOPENIA 04/13/2007  . OSTEOPOROSIS 05/23/2007  . SYNCOPE 02/15/2010    Past Surgical History  Procedure Date  . Abdominal hysterectomy   . Appendectomy   . Cholecystectomy   . Tubal ligation   .  Cervical laminectomy     Family History  Problem Relation Age of Onset  . Colon cancer Father 26  . Cancer Father 40    bladder ca  . Coronary artery disease Other   . Cancer Mother 3    pancreatic ca  . Heart disease Sister 20    CAD    History  Substance Use Topics  . Smoking status: Never Smoker   . Smokeless tobacco: Not on file  . Alcohol Use: No    OB History    Grav Para Term Preterm Abortions TAB SAB Ect Mult Living                  Review of Systems  Constitutional: Negative for fever and chills.  Gastrointestinal: Positive for abdominal pain and anorexia. Negative for nausea, vomiting and diarrhea.  Genitourinary: Negative for dysuria, vaginal bleeding and vaginal discharge.  All other systems reviewed and are negative.    Allergies  Codeine sulfate  Home Medications   Current Outpatient Rx  Name Route Sig Dispense Refill  . ALBUTEROL SULFATE HFA 108 (90 BASE) MCG/ACT IN AERS Inhalation Inhale 2 puffs into the lungs every 4 (four) hours as needed. 1 Inhaler 6  . ESTRADIOL 1 MG PO TABS Oral Take 0.5 mg by mouth daily.    Marland Kitchen HYDROXYZINE HCL 50 MG PO TABS Oral Take 50 mg by mouth 3 (three) times daily as needed.    Marland Kitchen NITROGLYCERIN 0.4 MG SL SUBL Sublingual Place 1 tablet (0.4 mg total) under the tongue every 5 (five) minutes as needed for chest pain. 20 tablet 3  .  RANITIDINE HCL 150 MG PO TABS Oral Take 300 mg by mouth at bedtime.    . TRAMADOL HCL 50 MG PO TABS Oral Take 50 mg by mouth every 6 (six) hours as needed. Pain    . ZOLPIDEM TARTRATE 5 MG PO TABS Oral Take 2.5 mg by mouth at bedtime as needed. Sleep    . CLINDINIUM-CHLORDIAZEPOXIDE 2.5-5 MG PO CAPS Oral Take 1 capsule by mouth 3 (three) times daily as needed. 60 capsule 1    BP 149/83  Pulse 109  Temp 99.1 F (37.3 C) (Oral)  Resp 20  SpO2 99%  Physical Exam  Nursing note and vitals reviewed. Constitutional: She is oriented to person, place, and time. She appears well-developed and  well-nourished. No distress.  HENT:  Head: Normocephalic.  Eyes: Conjunctivae are normal.  Neck: Neck supple.  Cardiovascular: Normal rate, regular rhythm and normal heart sounds.   Pulmonary/Chest: Effort normal and breath sounds normal. No respiratory distress. She has no wheezes. She has no rales.  Abdominal: Soft. Bowel sounds are normal. She exhibits no distension. There is tenderness. There is guarding.       RUQ, RLQ, LLQ tenderness and guarding  Musculoskeletal: She exhibits no edema.  Neurological: She is alert and oriented to person, place, and time.  Skin: Skin is warm and dry.  Psychiatric: She has a normal mood and affect.    ED Course  Procedures (including critical care time)  Labs Reviewed  CBC WITH DIFFERENTIAL - Abnormal; Notable for the following:    WBC 19.7 (*)     Platelets 418 (*)     Neutro Abs 14.8 (*)     Monocytes Absolute 1.8 (*)     All other components within normal limits  BASIC METABOLIC PANEL - Abnormal; Notable for the following:    Sodium 132 (*)     Chloride 95 (*)     Glucose, Bld 117 (*)     GFR calc non Af Amer 68 (*)     GFR calc Af Amer 79 (*)     All other components within normal limits   Dg Abd Acute W/chest  04/30/2012  *RADIOLOGY REPORT*  Clinical Data: Pain  ACUTE ABDOMEN SERIES (ABDOMEN 2 VIEW & CHEST 1 VIEW)  Comparison: None.  Findings: Normal heart size.  Clear lungs.  There is a single mildly distended small bowel loop in the left abdomen with air-fluid level.  Small amount of colonic gas is present.  No free intraperitoneal gas.  IMPRESSION: There is a single distended small bowel loop with an air-fluid level.  Early small bowel obstruction is not excluded.  No free intraperitoneal gas.  No active cardiopulmonary disease.   Original Report Authenticated By: Donavan Burnet, M.D.    Pt with elevated WBC. VS normal, she does not appear septic at this time. UA unremarkable at doctors office. Will get CT to r/o SBO. Pt not vomiting  at this time. Pain meds ordered.    Date: 04/30/2012  Rate: 87  Rhythm: normal sinus rhythm  QRS Axis: normal  Intervals: normal  ST/T Wave abnormalities: normal  Conduction Disutrbances:none  Narrative Interpretation:   Old EKG Reviewed: unchanged   Pt to be admitted for diverticulitis. Signed out to Dr. Weldon Inches at shift change.    1. Diverticulitis   2. Leukocytosis   3. Hyponatremia   4. Hyperglycemia   5. Diverticulosis of colon (without mention of hemorrhage)   6. Esophageal reflux   7. Abdominal pain, unspecified site  MDM          Lottie Mussel, PA 05/02/12 [phone]

## 2012-05-01 DIAGNOSIS — D72829 Elevated white blood cell count, unspecified: Secondary | ICD-10-CM

## 2012-05-01 DIAGNOSIS — R109 Unspecified abdominal pain: Secondary | ICD-10-CM

## 2012-05-01 LAB — URINALYSIS, ROUTINE W REFLEX MICROSCOPIC
Glucose, UA: NEGATIVE mg/dL
Leukocytes, UA: NEGATIVE
Protein, ur: NEGATIVE mg/dL
pH: 5 (ref 5.0–8.0)

## 2012-05-01 LAB — BASIC METABOLIC PANEL
GFR calc Af Amer: >90 mL/min (ref >90)
GFR calc non Af Amer: 88 mL/min — ABNORMAL LOW (ref >90)
Potassium: 3.9 mEq/L (ref 3.5–5.1)
Sodium: 133 mEq/L — ABNORMAL LOW (ref 135–145)

## 2012-05-01 LAB — CBC
Hemoglobin: 12.3 g/dL (ref 12.0–15.0)
MCHC: 33.2 g/dL (ref 30.0–36.0)
RBC: 4.06 MIL/uL (ref 3.87–5.11)

## 2012-05-01 MED ORDER — ONDANSETRON HCL 4 MG/2ML IJ SOLN
4.0000 mg | Freq: Four times a day (QID) | INTRAMUSCULAR | Status: DC | PRN
  Administered 2012-05-01 (×2): 4 mg via INTRAVENOUS
  Filled 2012-05-01 (×2): qty 2

## 2012-05-01 NOTE — H&P (Signed)
[facility] History and Physical  Sheila Hall:[id number] DOB: 1951/01/11    PCP:   Sheila Primes, MD   Chief Complaint: right sided abdominal pain.  HPI: Sheila Hall is an 61 y.o. female with up to date colonoscopy survey every five years for prior polyps, with diverticulosis seen on prior colonoscopy in the left and transverse colon, prior hx of diverticulitis on the left colon, s/p appendectomy and CCY, along with total hysterectomy, presents with one day of acute mid abd pain radiating to the right lower quadrant.  She denied any nausea, vomiting, black or bloody stool, and has had a normal bowel movement today.  No fever or chills.  Evaluation in the ER included a WBC of 19K along with an ABD/PELVIC CT showing transverse colon diverticulitis.  Her lipase and amylase, along with electrolytes and renal function tests were unremarkable.  Her plain film suggest possible early SBO.  Because of moderate pain, hospitalist was asked to admit her for inpatient treatment of diverticulitis.  Rewiew of Systems:  Constitutional: Negative for malaise, fever and chills. No significant weight loss or weight gain Eyes: Negative for eye pain, redness and discharge, diplopia, visual changes, or flashes of light. ENMT: Negative for ear pain, hoarseness, nasal congestion, sinus pressure and sore throat. No headaches; tinnitus, drooling, or problem swallowing. Cardiovascular: Negative for chest pain, palpitations, diaphoresis, dyspnea and peripheral edema. ; No orthopnea, PND Respiratory: Negative for cough, hemoptysis, wheezing and stridor. No pleuritic chestpain. Gastrointestinal: Negative for nausea, vomiting, diarrhea, constipation, melena, blood in stool, hematemesis, jaundice and rectal bleeding.    Genitourinary: Negative for frequency, dysuria, incontinence,flank pain and hematuria; Musculoskeletal: Negative for back pain and neck pain. Negative for swelling and trauma.;  Skin: .  Negative for pruritus, rash, abrasions, bruising and skin lesion.; ulcerations Neuro: Negative for headache, lightheadedness and neck stiffness. Negative for weakness, altered level of consciousness , altered mental status, extremity weakness, burning feet, involuntary movement, seizure and syncope.  Psych: negative for anxiety, depression, insomnia, tearfulness, panic attacks, hallucinations, paranoia, suicidal or homicidal ideation.   Past Medical History  Diagnosis Date  . ASTHMA 08/12/2007  . DISORDER, CERVICAL DISC W/MYELOPATHY 05/19/2007  . Edema 03/10/2009  . GERD 04/13/2007  . Headache 02/15/2010  . HYPERLIPIDEMIA 04/13/2007  . OSTEOPENIA 04/13/2007  . OSTEOPOROSIS 05/23/2007  . SYNCOPE 02/15/2010    Past Surgical History  Procedure Date  . Abdominal hysterectomy   . Appendectomy   . Cholecystectomy   . Tubal ligation   . Cervical laminectomy     Medications:  HOME MEDS: Prior to Admission medications   Medication Sig Start Date End Date Taking? Authorizing Provider  albuterol (PROAIR HFA) 108 (90 BASE) MCG/ACT inhaler Inhale 2 puffs into the lungs every 4 (four) hours as needed. 07/15/11  Yes Georgina Quint Plotnikov, MD  estradiol (ESTRACE) 1 MG tablet Take 0.5 mg by mouth daily.   Yes Historical Provider, MD  hydrOXYzine (ATARAX/VISTARIL) 50 MG tablet Take 50 mg by mouth 3 (three) times daily as needed.   Yes Historical Provider, MD  nitroGLYCERIN (NITROSTAT) 0.4 MG SL tablet Place 1 tablet (0.4 mg total) under the tongue every 5 (five) minutes as needed for chest pain. 04/27/12 04/27/13 Yes Georgina Quint Plotnikov, MD  ranitidine (ZANTAC) 150 MG tablet Take 300 mg by mouth at bedtime.   Yes Historical Provider, MD  traMADol (ULTRAM) 50 MG tablet Take 50 mg by mouth every 6 (six) hours as needed. Pain   Yes Historical Provider, MD  zolpidem (  AMBIEN) 5 MG tablet Take 2.5 mg by mouth at bedtime as needed. Sleep   Yes Historical Provider, MD  clidinium-chlordiazePOXIDE (LIBRAX) 2.5-5 MG per  capsule Take 1 capsule by mouth 3 (three) times daily as needed. 04/27/12 05/27/12  Tresa Garter, MD     Allergies:  Allergies  Allergen Reactions  . Codeine Sulfate     REACTION: nausea    Social History:   reports that she has never smoked. She does not have any smokeless tobacco history on file. She reports that she does not drink alcohol or use illicit drugs.  Family History: Family History  Problem Relation Age of Onset  . Colon cancer Father 47  . Cancer Father 42    bladder ca  . Coronary artery disease Other   . Cancer Mother 52    pancreatic ca  . Heart disease Sister 55    CAD     Physical Exam: Filed Vitals:   04/30/12 1827 04/30/12 2122 04/30/12 2244  BP: 149/83 134/63 143/67  Pulse: 109 88 92  Temp: 99.1 F (37.3 C)  98.3 F (36.8 C)  TempSrc: Oral  Oral  Resp: 20 18 18   Height:   5\' 8"  (1.727 m)  Weight:   86.183 kg (190 lb)  SpO2: 99% 98% 99%   Blood pressure 143/67, pulse 92, temperature 98.3 F (36.8 C), temperature source Oral, resp. rate 18, height 5\' 8"  (1.727 m), weight 86.183 kg (190 lb), SpO2 99.00%.  GEN:  Pleasant  patient lying in the stretcher in no acute distress; cooperative with exam. PSYCH:  alert and oriented x4; does not appear anxious or depressed; affect is appropriate. HEENT: Mucous membranes pink and anicteric; PERRLA; EOM intact; no cervical lymphadenopathy nor thyromegaly or carotid bruit; no JVD; There were no stridor. Neck is very supple. Breasts:: Not examined CHEST WALL: No tenderness CHEST: Normal respiration, clear to auscultation bilaterally.  HEART: Regular rate and rhythm.  There are no murmur, rub, or gallops.   BACK: No kyphosis or scoliosis; no CVA tenderness ABDOMEN: soft and tender over mid and right lower quadrant; no masses, no organomegaly, normal abdominal bowel sounds; no pannus; no intertriginous candida. There is no rebound and no distention. Rectal Exam: Not done EXTREMITIES: No bone or joint  deformity; age-appropriate arthropathy of the hands and knees; no edema; no ulcerations.  There is no calf tenderness. Genitalia: not examined PULSES: 2+ and symmetric SKIN: Normal hydration no rash or ulceration CNS: Cranial nerves 2-12 grossly intact no focal lateralizing neurologic deficit.  Speech is fluent; uvula elevated with phonation, facial symmetry and tongue midline. DTR are normal bilaterally, cerebella exam is intact, barbinski is negative and strengths are equaled bilaterally.  No sensory loss.   Labs on Admission:  Basic Metabolic Panel:  Lab 04/30/12 1610 04/30/12 1714  NA 132* 133*  K 4.0 4.8  CL 95* 100  CO2 25 26  GLUCOSE 117* 110*  BUN 13 13  CREATININE 0.89 0.7  CALCIUM 9.2 9.0  MG -- --  PHOS -- --   Liver Function Tests:  Lab 04/30/12 1714 04/27/12 1715  AST 31 24  ALT 27 27  ALKPHOS 71 60  BILITOT 1.3* 0.6  PROT 7.9 7.0  ALBUMIN 4.3 4.1    Lab 04/30/12 1714 04/27/12 1715  LIPASE 25.0 33.0  AMYLASE 40 --   No results found for this basename: AMMONIA:5 in the last 168 hours CBC:  Lab 04/30/12 1845 04/30/12 1714 04/27/12 1715  WBC 19.7* 21.0 Repeated  and verified X2.* 8.4  NEUTROABS 14.8* 16.4* 4.2  HGB 14.3 14.6 13.7  HCT 42.0 44.8 42.0  MCV 90.9 93.1 93.6  PLT 418* 435.0 Repeated and verified X2.* 432.0*   Cardiac Enzymes:  Lab 04/27/12 1715  CKTOTAL --  CKMB 1.2  CKMBINDEX --  TROPONINI --    CBG: No results found for this basename: GLUCAP:5 in the last 168 hours   Radiological Exams on Admission: Ct Abdomen Pelvis W Contrast  04/30/2012  *RADIOLOGY REPORT*  Clinical Data: Abdominal pain  CT ABDOMEN AND PELVIS WITH CONTRAST  Technique:  Multidetector CT imaging of the abdomen and pelvis was performed following the standard protocol during bolus administration of intravenous contrast.  Contrast: OMNIPAQUE IOHEXOL 300 MG/ML  SOLN  Comparison: 02/21/2005  Findings: There is focal marked bowel wall thickening in the transverse  colon.  There is low density within the wall compatible with edema.  Stranding in the adjacent fat is present compatible with inflammatory change.  Diverticulosis of the transverse colon is associated.  There is no extraluminal bowel gas or abscess formation.  Gallbladder is absent.  Liver, spleen, pancreas, adrenal glands, kidneys are within normal limits.  There is extensive diverticulosis of the sigmoid colon.  Bladder is distended.  Uterus is absent.  Severe L5-S1 degenerative disc disease.  Less prominent degenerative disc disease at L3-4 and L4-5.  IMPRESSION: Findings most consistent with acute diverticulitis in the transverse colon.  Malignancy can have a similar appearance. Correlate clinically as for the need for follow-up imaging.  No evidence of abscess or perforation.   Original Report Authenticated By: Donavan Burnet, M.D.    Dg Abd Acute W/chest  04/30/2012  *RADIOLOGY REPORT*  Clinical Data: Pain  ACUTE ABDOMEN SERIES (ABDOMEN 2 VIEW & CHEST 1 VIEW)  Comparison: None.  Findings: Normal heart size.  Clear lungs.  There is a single mildly distended small bowel loop in the left abdomen with air-fluid level.  Small amount of colonic gas is present.  No free intraperitoneal gas.  IMPRESSION: There is a single distended small bowel loop with an air-fluid level.  Early small bowel obstruction is not excluded.  No free intraperitoneal gas.  No active cardiopulmonary disease.   Original Report Authenticated By: Donavan Burnet, M.D.      Assessment/Plan Present on Admission:  .ABDOMINAL PAIN .Abdominal tenderness, right lower quadrant .Diverticulosis of colon (without mention of hemorrhage) .GERD .ASTHMA   PLAN:  WIll admit her for inpatient tx of right sided diverticulitis.  She will received Cipro/Flagyl.  Will start clear liquid and please advance her diet as tolerated.  It s good she has been keeping up with her colonoscopy to exclude malignancy.  She did have GERD symptoms and did receive  medicine for it, I will give her pepcid at 20mg  q 12hours.  She is very stable, full code, and will be admitted to [facility] service.    Other plans as per orders.  Code Status: FULL Unk Lightning, MD. [facility] Pager [phone] 7pm to 7am.  05/01/2012, 1:21 AM

## 2012-05-01 NOTE — Progress Notes (Signed)
[facility] PROGRESS NOTE  Sheila Hall HQI:[id number] DOB: 01-22-1951 DOA: 04/30/2012 PCP: Sonda Primes, MD  Assessment/Plan: Active Problems:  ASTHMA  GERD  Diverticulosis of colon (without mention of hemorrhage)  ABDOMINAL PAIN  Abdominal tenderness, right lower quadrant  1. Diverticulitis- cipro/flagyl day #2 2. Leukocytosis- improved 3. Abdominal pain- still having  Code Status: full Family Communication: family at bedside Disposition Plan: home when better    HPI/Subjective: Still with abdominal pain No fever or chills  Objective: Filed Vitals:   04/30/12 1827 04/30/12 2122 04/30/12 2244 05/01/12 0509  BP: 149/83 134/63 143/67 119/63  Pulse: 109 88 92 77  Temp: 99.1 F (37.3 C)  98.3 F (36.8 C) 98.1 F (36.7 C)  TempSrc: Oral  Oral Oral  Resp: 20 18 18 18   Height:   5\' 8"  (1.727 m)   Weight:   86.183 kg (190 lb)   SpO2: 99% 98% 99% 97%   No intake or output data in the 24 hours ending 05/01/12 0859 Filed Weights   04/30/12 2244  Weight: 86.183 kg (190 lb)    Exam:   General:  A+Ox3  Cardiovascular: rrr  Respiratory: clear anterior  Abdomen: +Bs, +tenderness  Data Reviewed: Basic Metabolic Panel:  Lab 05/01/12 1324 04/30/12 1845 04/30/12 1714  NA 133* 132* 133*  K 3.9 4.0 4.8  CL 99 95* 100  CO2 25 25 26   GLUCOSE 141* 117* 110*  BUN 10 13 13   CREATININE 0.78 0.89 0.7  CALCIUM 8.1* 9.2 9.0  MG -- -- --  PHOS -- -- --   Liver Function Tests:  Lab 04/30/12 1714 04/27/12 1715  AST 31 24  ALT 27 27  ALKPHOS 71 60  BILITOT 1.3* 0.6  PROT 7.9 7.0  ALBUMIN 4.3 4.1    Lab 04/30/12 1714 04/27/12 1715  LIPASE 25.0 33.0  AMYLASE 40 --   No results found for this basename: AMMONIA:5 in the last 168 hours CBC:  Lab 05/01/12 0445 04/30/12 1845 04/30/12 1714 04/27/12 1715  WBC 14.1* 19.7* 21.0 Repeated and verified X2.* 8.4  NEUTROABS -- 14.8* 16.4* 4.2  HGB 12.3 14.3 14.6 13.7  HCT 37.1 42.0 44.8 42.0  MCV 91.4 90.9  93.1 93.6  PLT 327 418* 435.0 Repeated and verified X2.* 432.0*   Cardiac Enzymes:  Lab 04/27/12 1715  CKTOTAL --  CKMB 1.2  CKMBINDEX --  TROPONINI --   BNP (last 3 results) No results found for this basename: PROBNP:3 in the last 8760 hours CBG: No results found for this basename: GLUCAP:5 in the last 168 hours  No results found for this or any previous visit (from the past 240 hour(s)).   Studies: Ct Abdomen Pelvis W Contrast  04/30/2012  *RADIOLOGY REPORT*  Clinical Data: Abdominal pain  CT ABDOMEN AND PELVIS WITH CONTRAST  Technique:  Multidetector CT imaging of the abdomen and pelvis was performed following the standard protocol during bolus administration of intravenous contrast.  Contrast: OMNIPAQUE IOHEXOL 300 MG/ML  SOLN  Comparison: 02/21/2005  Findings: There is focal marked bowel wall thickening in the transverse colon.  There is low density within the wall compatible with edema.  Stranding in the adjacent fat is present compatible with inflammatory change.  Diverticulosis of the transverse colon is associated.  There is no extraluminal bowel gas or abscess formation.  Gallbladder is absent.  Liver, spleen, pancreas, adrenal glands, kidneys are within normal limits.  There is extensive diverticulosis of the sigmoid colon.  Bladder is distended.  Uterus  is absent.  Severe L5-S1 degenerative disc disease.  Less prominent degenerative disc disease at L3-4 and L4-5.  IMPRESSION: Findings most consistent with acute diverticulitis in the transverse colon.  Malignancy can have a similar appearance. Correlate clinically as for the need for follow-up imaging.  No evidence of abscess or perforation.   Original Report Authenticated By: Donavan Burnet, M.D.    Dg Abd Acute W/chest  04/30/2012  *RADIOLOGY REPORT*  Clinical Data: Pain  ACUTE ABDOMEN SERIES (ABDOMEN 2 VIEW & CHEST 1 VIEW)  Comparison: None.  Findings: Normal heart size.  Clear lungs.  There is a single mildly distended  small bowel loop in the left abdomen with air-fluid level.  Small amount of colonic gas is present.  No free intraperitoneal gas.  IMPRESSION: There is a single distended small bowel loop with an air-fluid level.  Early small bowel obstruction is not excluded.  No free intraperitoneal gas.  No active cardiopulmonary disease.   Original Report Authenticated By: Donavan Burnet, M.D.     Scheduled Meds:   . ciprofloxacin  400 mg Intravenous Once  . ciprofloxacin  400 mg Intravenous Q12H  . estradiol  0.5 mg Oral Daily  . famotidine  20 mg Oral BID  . metronidazole  500 mg Intravenous Once  . metronidazole  500 mg Intravenous Q8H  .  morphine injection  4 mg Intravenous Once  .  morphine injection  4 mg Intravenous Once  . ondansetron  4 mg Intravenous Once  . DISCONTD: sodium chloride   Intravenous STAT  . DISCONTD: morphine  4 mg Intravenous Once  . DISCONTD: ondansetron (ZOFRAN) IV  4 mg Intravenous Once   Continuous Infusions:   . dextrose 5 % and 0.9 % NaCl with KCl 20 mEq/L 100 mL/hr at 04/30/12 2341  . DISCONTD: sodium chloride 125 mL/hr at 04/30/12 1910    Active Problems:  ASTHMA  GERD  Diverticulosis of colon (without mention of hemorrhage)  ABDOMINAL PAIN  Abdominal tenderness, right lower quadrant    Time spent: 25    Marlin Canary  [facility] Pager [phone] 05/01/2012, 8:59 AM  LOS: 1 day

## 2012-05-02 DIAGNOSIS — R10813 Right lower quadrant abdominal tenderness: Secondary | ICD-10-CM

## 2012-05-02 LAB — BASIC METABOLIC PANEL
BUN: 5 mg/dL — ABNORMAL LOW (ref 6–23)
Chloride: 107 mEq/L (ref 96–112)
GFR calc Af Amer: >90 mL/min (ref >90)
Potassium: 3.8 mEq/L (ref 3.5–5.1)
Sodium: 138 mEq/L (ref 135–145)

## 2012-05-02 LAB — CBC
HCT: 35.7 % — ABNORMAL LOW (ref 36.0–46.0)
RDW: 13.1 % (ref 11.5–15.5)
WBC: 7.3 10*3/uL (ref 4.0–10.5)

## 2012-05-02 MED ORDER — METRONIDAZOLE 500 MG PO TABS
500.0000 mg | ORAL_TABLET | Freq: Three times a day (TID) | ORAL | Status: DC
  Administered 2012-05-02: 500 mg via ORAL
  Filled 2012-05-02 (×3): qty 1

## 2012-05-02 MED ORDER — CIPROFLOXACIN HCL 500 MG PO TABS: 500.0000 mg | ORAL_TABLET | Freq: Two times a day (BID) | ORAL | Status: AC

## 2012-05-02 MED ORDER — METRONIDAZOLE 500 MG PO TABS: 500.0000 mg | ORAL_TABLET | Freq: Three times a day (TID) | ORAL | Status: AC

## 2012-05-02 MED ORDER — DOCUSATE SODIUM 100 MG PO CAPS
100.0000 mg | ORAL_CAPSULE | Freq: Two times a day (BID) | ORAL | Status: DC
  Administered 2012-05-02: 100 mg via ORAL
  Filled 2012-05-02 (×2): qty 1

## 2012-05-02 MED ORDER — CIPROFLOXACIN HCL 500 MG PO TABS
500.0000 mg | ORAL_TABLET | Freq: Two times a day (BID) | ORAL | Status: DC
  Administered 2012-05-02: 500 mg via ORAL
  Filled 2012-05-02 (×3): qty 1

## 2012-05-02 NOTE — Progress Notes (Signed)
Gave pt discharge instructions and prescriptions. Pt verbalized understanding of instructions given. Pt left walking with tech in no acute distress.

## 2012-05-02 NOTE — ED Provider Notes (Signed)
Medical screening examination/treatment/procedure(s) were conducted as a shared visit with non-physician practitioner(s) and myself.  I personally evaluated the patient during the encounter  Cheri Guppy, MD 05/02/12 [phone]

## 2012-05-02 NOTE — Discharge Summary (Signed)
Physician Discharge Summary  Sheila Hall:[id number] DOB: [birth year] DOA: 04/30/2012  PCP: Sonda Primes, MD  Admit date: 04/30/2012 Discharge date: 05/02/2012  Discharge Diagnoses:  Active Problems:  ASTHMA  GERD  Diverticulosis of colon (without mention of hemorrhage)  ABDOMINAL PAIN  Abdominal tenderness, right lower quadrant   Discharge Condition: improved  Diet recommendation: bland and advance  Filed Weights   04/30/12 2244  Weight: 86.183 kg (190 lb)    History of present illness:  Sheila Hall is an 61 y.o. female with up to date colonoscopy survey every five years for prior polyps, with diverticulosis seen on prior colonoscopy in the left and transverse colon, prior hx of diverticulitis on the left colon, s/p appendectomy and CCY, along with total hysterectomy, presents with one day of acute mid abd pain radiating to the right lower quadrant. She denied any nausea, vomiting, black or bloody stool, and has had a normal bowel movement today. No fever or chills. Evaluation in the ER included a WBC of 19K along with an ABD/PELVIC CT showing transverse colon diverticulitis. Her lipase and amylase, along with electrolytes and renal function tests were unremarkable. Her plain film suggest possible early SBO. Because of moderate pain, hospitalist was asked to admit her for inpatient treatment of diverticulitis.   Hospital Course:  Diverticulitis- cipro/flagyl day #3 /10- will finish course at home- follow up with GI physicians as out patient, return to ER if symptoms do not improve Leukocytosis- improved Already has ultram and phenergan at home     Discharge Exam: Filed Vitals:   05/02/12 0514  BP: 113/63  Pulse: 75  Temp: 98.5 F (36.9 C)  Resp: 18   Filed Vitals:   04/30/12 2244 05/01/12 0509 05/01/12 2114 05/02/12 0514  BP: 143/67 119/63 120/60 113/63  Pulse: 92 77 62 75  Temp: 98.3 F (36.8 C) 98.1 F (36.7 C) 98.4 F (36.9 C) 98.5 F (36.9 C)    TempSrc: Oral Oral Oral Oral  Resp: 18 18 18 18   Height: 5\' 8"  (1.727 m)     Weight: 86.183 kg (190 lb)     SpO2: 99% 97% 100% 100%    General: A+Ox3, NAD Cardiovascular: rrr Respiratory: clear anterior  Discharge Instructions  Discharge Orders    Future Appointments: Provider: Department: Dept Phone: Center:   05/08/2012 9:30 AM [facility] [phone] None   05/08/2012 9:30 AM [facility] Nuclear 2 (Nuc Treadm) [facility] Nuclear [facility] [phone] None     Medication List  As of 05/02/2012  9:29 AM   ASK your doctor about these medications         albuterol 108 (90 BASE) MCG/ACT inhaler   Commonly known as: PROVENTIL HFA;VENTOLIN HFA   Inhale 2 puffs into the lungs every 4 (four) hours as needed.      clidinium-chlordiazePOXIDE 2.5-5 MG per capsule   Commonly known as: LIBRAX   Take 1 capsule by mouth 3 (three) times daily as needed.      estradiol 1 MG tablet   Commonly known as: ESTRACE   Take 0.5 mg by mouth daily.      hydrOXYzine 50 MG tablet   Commonly known as: ATARAX/VISTARIL   Take 50 mg by mouth 3 (three) times daily as needed.      nitroGLYCERIN 0.4 MG SL tablet   Commonly known as: NITROSTAT   Place 1 tablet (0.4 mg total) under the tongue every 5 (five) minutes as needed for chest pain.  ranitidine 150 MG tablet   Commonly known as: ZANTAC   Take 300 mg by mouth at bedtime.      traMADol 50 MG tablet   Commonly known as: ULTRAM   Take 50 mg by mouth every 6 (six) hours as needed. Pain      zolpidem 5 MG tablet   Commonly known as: AMBIEN   Take 2.5 mg by mouth at bedtime as needed. Sleep              The results of significant diagnostics from this hospitalization (including imaging, microbiology, ancillary and laboratory) are listed below for reference.    Significant Diagnostic Studies: Ct Abdomen Pelvis W Contrast  04/30/2012  *RADIOLOGY REPORT*  Clinical Data: Abdominal pain  CT ABDOMEN AND PELVIS WITH CONTRAST   Technique:  Multidetector CT imaging of the abdomen and pelvis was performed following the standard protocol during bolus administration of intravenous contrast.  Contrast: OMNIPAQUE IOHEXOL 300 MG/ML  SOLN  Comparison: 02/21/2005  Findings: There is focal marked bowel wall thickening in the transverse colon.  There is low density within the wall compatible with edema.  Stranding in the adjacent fat is present compatible with inflammatory change.  Diverticulosis of the transverse colon is associated.  There is no extraluminal bowel gas or abscess formation.  Gallbladder is absent.  Liver, spleen, pancreas, adrenal glands, kidneys are within normal limits.  There is extensive diverticulosis of the sigmoid colon.  Bladder is distended.  Uterus is absent.  Severe L5-S1 degenerative disc disease.  Less prominent degenerative disc disease at L3-4 and L4-5.  IMPRESSION: Findings most consistent with acute diverticulitis in the transverse colon.  Malignancy can have a similar appearance. Correlate clinically as for the need for follow-up imaging.  No evidence of abscess or perforation.   Original Report Authenticated By: Donavan Burnet, M.D.    Dg Abd Acute W/chest  04/30/2012  *RADIOLOGY REPORT*  Clinical Data: Pain  ACUTE ABDOMEN SERIES (ABDOMEN 2 VIEW & CHEST 1 VIEW)  Comparison: None.  Findings: Normal heart size.  Clear lungs.  There is a single mildly distended small bowel loop in the left abdomen with air-fluid level.  Small amount of colonic gas is present.  No free intraperitoneal gas.  IMPRESSION: There is a single distended small bowel loop with an air-fluid level.  Early small bowel obstruction is not excluded.  No free intraperitoneal gas.  No active cardiopulmonary disease.   Original Report Authenticated By: Donavan Burnet, M.D.     Microbiology: No results found for this or any previous visit (from the past 240 hour(s)).   Labs: Basic Metabolic Panel:  Lab 05/02/12 9604 05/01/12 0445  04/30/12 1845 04/30/12 1714  NA 138 133* 132* 133*  K 3.8 3.9 4.0 4.8  CL 107 99 95* 100  CO2 25 25 25 26   GLUCOSE 111* 141* 117* 110*  BUN 5* 10 13 13   CREATININE 0.70 0.78 0.89 0.7  CALCIUM 8.0* 8.1* 9.2 9.0  MG -- -- -- --  PHOS -- -- -- --   Liver Function Tests:  Lab 04/30/12 1714 04/27/12 1715  AST 31 24  ALT 27 27  ALKPHOS 71 60  BILITOT 1.3* 0.6  PROT 7.9 7.0  ALBUMIN 4.3 4.1    Lab 04/30/12 1714 04/27/12 1715  LIPASE 25.0 33.0  AMYLASE 40 --   No results found for this basename: AMMONIA:5 in the last 168 hours CBC:  Lab 05/02/12 0555 05/01/12 0445 04/30/12 1845 04/30/12 1714  04/27/12 1715  WBC 7.3 14.1* 19.7* 21.0 Repeated and verified X2.* 8.4  NEUTROABS -- -- 14.8* 16.4* 4.2  HGB 11.7* 12.3 14.3 14.6 13.7  HCT 35.7* 37.1 42.0 44.8 42.0  MCV 93.5 91.4 90.9 93.1 93.6  PLT 333 327 418* 435.0 Repeated and verified X2.* 432.0*   Cardiac Enzymes:  Lab 04/27/12 1715  CKTOTAL --  CKMB 1.2  CKMBINDEX --  TROPONINI --   BNP: BNP (last 3 results) No results found for this basename: PROBNP:3 in the last 8760 hours CBG: No results found for this basename: GLUCAP:5 in the last 168 hours  Time coordinating discharge: 45 minutes  Signed:  Marlin Canary  [facility] 05/02/2012, 9:29 AM

## 2012-05-04 ENCOUNTER — Telehealth: Payer: Self-pay | Admitting: Internal Medicine

## 2012-05-04 DIAGNOSIS — K5732 Diverticulitis of large intestine without perforation or abscess without bleeding: Secondary | ICD-10-CM

## 2012-05-04 NOTE — Telephone Encounter (Signed)
Noted - will sch GI appt Dr Russella Dar. I can see her if needed too Thx

## 2012-05-04 NOTE — Telephone Encounter (Signed)
Was in the hospital last week.  Came home Sat.  Still on antibiotics.  Still having GI problems.  Pain. No fever.  Wants a referral to GI.

## 2012-05-05 ENCOUNTER — Encounter: Payer: Self-pay | Admitting: Gastroenterology

## 2012-05-05 NOTE — Telephone Encounter (Signed)
Pt is aware.  She will call if she needs to come before the GI appt can be set up.

## 2012-05-06 ENCOUNTER — Ambulatory Visit (INDEPENDENT_AMBULATORY_CARE_PROVIDER_SITE_OTHER): Payer: BC Managed Care – PPO | Admitting: Nurse Practitioner

## 2012-05-06 ENCOUNTER — Other Ambulatory Visit (INDEPENDENT_AMBULATORY_CARE_PROVIDER_SITE_OTHER): Payer: BC Managed Care – PPO

## 2012-05-06 ENCOUNTER — Encounter: Payer: Self-pay | Admitting: Nurse Practitioner

## 2012-05-06 VITALS — BP 130/70 | HR 66 | Ht 67.0 in | Wt 188.4 lb

## 2012-05-06 DIAGNOSIS — K5792 Diverticulitis of intestine, part unspecified, without perforation or abscess without bleeding: Secondary | ICD-10-CM

## 2012-05-06 DIAGNOSIS — K5732 Diverticulitis of large intestine without perforation or abscess without bleeding: Secondary | ICD-10-CM

## 2012-05-06 DIAGNOSIS — R112 Nausea with vomiting, unspecified: Secondary | ICD-10-CM

## 2012-05-06 LAB — CBC WITH DIFFERENTIAL/PLATELET
Basophils Relative: 0.4 % (ref 0.0–3.0)
Eosinophils Relative: 0.4 % (ref 0.0–5.0)
HCT: 44.7 % (ref 36.0–46.0)
Hemoglobin: 14.6 g/dL (ref 12.0–15.0)
Lymphs Abs: 1.8 10*3/uL (ref 0.7–4.0)
Monocytes Relative: 9.2 % (ref 3.0–12.0)
Neutro Abs: 6 10*3/uL (ref 1.4–7.7)
RBC: 4.79 Mil/uL (ref 3.87–5.11)
RDW: 12.7 % (ref 11.5–14.6)
WBC: 8.6 10*3/uL (ref 4.5–10.5)

## 2012-05-06 MED ORDER — AMOXICILLIN-POT CLAVULANATE 875-125 MG PO TABS: 1.0000 | ORAL_TABLET | Freq: Two times a day (BID) | ORAL | Status: DC

## 2012-05-06 MED ORDER — PRAMOXINE-HC 1-2.5 % EX CREA: TOPICAL_CREAM | CUTANEOUS | Status: DC

## 2012-05-06 NOTE — Patient Instructions (Addendum)
Please go to the basement level to have your labs drawn.  Stop the Cipro and Flagyl. We sent a prescription for Amoxicillin to [facility].  We made you an appointment with Dr. Russella Dar on 05-22-2012 at 3:15 PM.

## 2012-05-06 NOTE — Progress Notes (Addendum)
TAJE TONDREAU [id number] 1951-06-15   HISTORY OR PRESENT ILLNESS :  Patient is a 61 year old female who had a screening colonoscopy with Dr. Russella Dar December 2011 for family history of colon cancer. Findings included diverticular disease and adenomatous colon polyps. She had diverticulitis documented by CT scan back in 2001. She reports several episodes of diverticulitis over the years, all of which responded to antibiotics.  Patient  saw her PCP 8/19 for epigastric pain. She had been on Prilosec for years,  PCP discontinued Prilosec and started twice daily Dexilant. Plan was for CTscan and GI evaluation if pain didn't improve. When labs revealed WBC >20 patient was advised to go to ED. She was admitted with abdominal pain, nausea and vomiting. CT scan of the abdomen and pelvis with contrast was compatible with acute diverticulitis of the transverse colon. . Patient was released from the hospital 05/02/12 and since that time has not been doing well. Her abdominal pain is better but she has ongoing nausea and vomiting. Yesterday she vomited dark brown material. No black stools. Having dry heaves this am but feeling better with anti-emetics.  She is on tramadol for pain, not taking any new medications which could be causing the nausea.   Current Medications, Allergies, Past Medical History, Past Surgical History, Family History and Social History were reviewed in [employer] record.   PHYSICAL EXAMINATION : General:  Well developed  female in no acute distress Head: Normocephalic and atraumatic Eyes:  sclerae anicteric,conjunctive pink. Ears: Normal auditory acuity Neck: Supple, no masses.  Lungs: Clear throughout to auscultation Heart: Regular rate and rhythm Abdomen: Soft, nondistended, right mid tenderness and mid upper abdominal tenderness. No masses or hepatomegaly noted. Normal bowel sounds Rectal: Large external colon, light brown Hemoccult negative  stool. Musculoskeletal: Symmetrical with no gross deformities  Skin: No lesions on visible extremities Extremities: No edema or deformities noted Neurological: Oriented x 4, grossly nonfocal Cervical Nodes:  No significant cervical adenopathy Psychological:  Alert and cooperative. Normal mood and affect  ASSESSMENT AND PLAN :   Recurrent diverticultis, this time of transverse colon.  She is 5 days shy of a one week course of Cipro and Flagyl. She is still tender on exam, having nausea and vomiting and overall doesn't feel well. Will extend antibiotic course but change to amoxicillin. Clear liquid diet until feeling better. Obtain CBC today. Return to clinic in 10-14 days but in the interim she will call for fever or worsening pain at which time we may need repeat her CT scan. If nausea and vomiting persist she may need upper endoscopy.  2. Nausea and vomiting, see #1. She had some brown vomitus yesterday (not coffee grounds). Heme negative on exam. Will check a CBC, continue PPI. Further recommendations pending clinical course.  Will call her in a few days for condition update.

## 2012-05-08 ENCOUNTER — Other Ambulatory Visit (HOSPITAL_COMMUNITY): Payer: BC Managed Care – PPO

## 2012-05-12 NOTE — Progress Notes (Signed)
Reviewed and agree with management. Aranza Geddes D. Analyn Matusek, M.D., FACG  

## 2012-05-13 ENCOUNTER — Ambulatory Visit: Payer: BC Managed Care – PPO | Admitting: Gastroenterology

## 2012-05-16 ENCOUNTER — Other Ambulatory Visit: Payer: Self-pay | Admitting: Internal Medicine

## 2012-05-22 ENCOUNTER — Encounter: Payer: Self-pay | Admitting: Gastroenterology

## 2012-05-22 ENCOUNTER — Ambulatory Visit (INDEPENDENT_AMBULATORY_CARE_PROVIDER_SITE_OTHER): Payer: BC Managed Care – PPO | Admitting: Gastroenterology

## 2012-05-22 VITALS — BP 120/76 | HR 76 | Ht 67.0 in | Wt 186.8 lb

## 2012-05-22 DIAGNOSIS — R1033 Periumbilical pain: Secondary | ICD-10-CM

## 2012-05-22 DIAGNOSIS — K5732 Diverticulitis of large intestine without perforation or abscess without bleeding: Secondary | ICD-10-CM

## 2012-05-22 MED ORDER — AMOXICILLIN-POT CLAVULANATE 875-125 MG PO TABS: 1.0000 | ORAL_TABLET | Freq: Two times a day (BID) | ORAL | Status: AC

## 2012-05-22 NOTE — Progress Notes (Signed)
History of Present Illness: This is a 60 year old female who was recently diagnosed with diverticulitis involving her transverse colon and CT scan. She had periumbilical abdominal pain that radiated to the right lower corner. She was treated with a course of Cipro and Flagyl and her symptoms have not substantially improved so she was changed to a 10 day course of Augmentin and her symptoms have improved however she has persistent mild periumbilical tenderness. She remains on a liquid diet.  Current Medications, Allergies, Past Medical History, Past Surgical History, Family History and Social History were reviewed in [employer] record.  Physical Exam: General: Well developed , well nourished, no acute distress Head: Normocephalic and atraumatic Eyes:  sclerae anicteric, EOMI Ears: Normal auditory acuity Mouth: No deformity or lesions Lungs: Clear throughout to auscultation Heart: Regular rate and rhythm; no murmurs, rubs or bruits Abdomen: Soft, mild periumbilical and right lower quadrant tenderness to deep palpation without rebound or guarding and non distended. No masses, hepatosplenomegaly or hernias noted. Normal Bowel sounds Musculoskeletal: Symmetrical with no gross deformities  Pulses:  Normal pulses noted Extremities: No clubbing, cyanosis, edema or deformities noted Neurological: Alert oriented x 4, grossly nonfocal Psychological:  Alert and cooperative. Normal mood and affect  Assessment and Recommendations:  1. Recurrent diverticulitis. This episode involved her transverse colon. She has persistent tenderness on physical exam. Advised to slowly advance her diet to low residue diet and then to a high fiber diet. If her pain persists or worsens will proceed with repeat blood work and a repeat CT scan of the abdomen/pelvis. She underwent colonoscopy in 2011 but we can consider repeating this if she has recurrent episodes or persistent symptoms without clear  etiology. She was given prescription for Augmentin #20 with 2 refills in case she has recurrent symptoms of diverticulitis. I advised her to contact her office if she has symptoms that she feels may require antibiotic and that I would prefer that she was seen if she has another flare.

## 2012-05-22 NOTE — Patient Instructions (Addendum)
We have printed a prescription for Augmentin to use for further diverticulitis flare ups. Start using Librax as needed for abdominal pain. Advance your diet as tolerated. Call back if your symptoms get worse or you cannot advance your diet next week.  cc: Sonda Primes, MD

## 2012-06-09 ENCOUNTER — Ambulatory Visit (HOSPITAL_COMMUNITY): Payer: BC Managed Care – PPO | Attending: Cardiology | Admitting: Radiology

## 2012-06-09 ENCOUNTER — Encounter: Payer: Self-pay | Admitting: Internal Medicine

## 2012-06-09 ENCOUNTER — Ambulatory Visit (HOSPITAL_BASED_OUTPATIENT_CLINIC_OR_DEPARTMENT_OTHER): Payer: BC Managed Care – PPO

## 2012-06-09 VITALS — BP 131/82 | Ht 68.0 in | Wt 188.0 lb

## 2012-06-09 DIAGNOSIS — R079 Chest pain, unspecified: Secondary | ICD-10-CM

## 2012-06-09 DIAGNOSIS — R0989 Other specified symptoms and signs involving the circulatory and respiratory systems: Secondary | ICD-10-CM

## 2012-06-09 DIAGNOSIS — R072 Precordial pain: Secondary | ICD-10-CM | POA: Insufficient documentation

## 2012-06-09 DIAGNOSIS — Z8249 Family history of ischemic heart disease and other diseases of the circulatory system: Secondary | ICD-10-CM | POA: Insufficient documentation

## 2012-06-09 MED ORDER — SODIUM CHLORIDE 0.9 % IV SOLN
40.0000 ug/kg | INTRAVENOUS | Status: AC
  Administered 2012-06-09: 40 ug/kg/min via INTRAVENOUS

## 2012-06-09 NOTE — Progress Notes (Signed)
Echocardiogram performed.  

## 2012-06-10 ENCOUNTER — Telehealth: Payer: Self-pay | Admitting: Internal Medicine

## 2012-06-10 NOTE — Telephone Encounter (Signed)
Sheila Hall, please, inform patient that her stress ECHO was nl Thx

## 2012-06-10 NOTE — Telephone Encounter (Signed)
Left detailed mess informing pt.  

## 2012-07-13 ENCOUNTER — Telehealth: Payer: Self-pay | Admitting: Internal Medicine

## 2012-07-13 NOTE — Telephone Encounter (Signed)
OK to fill this prescription #90 with additional refills x1 Thank you!  

## 2012-07-13 NOTE — Telephone Encounter (Signed)
REFILL ON AMBIEN  TO [facility]

## 2012-07-14 ENCOUNTER — Other Ambulatory Visit: Payer: Self-pay | Admitting: Internal Medicine

## 2012-07-15 MED ORDER — ZOLPIDEM TARTRATE 5 MG PO TABS: 2.5000 mg | ORAL_TABLET | Freq: Every evening | ORAL | Status: DC | PRN

## 2012-07-15 NOTE — Telephone Encounter (Signed)
Rx faxed to [facility]

## 2012-07-15 NOTE — Telephone Encounter (Signed)
Rx printed/pending MD sig. 

## 2012-07-23 ENCOUNTER — Other Ambulatory Visit: Payer: Self-pay | Admitting: Internal Medicine

## 2012-08-11 ENCOUNTER — Other Ambulatory Visit: Payer: Self-pay | Admitting: Internal Medicine

## 2012-08-11 DIAGNOSIS — Z0000 Encounter for general adult medical examination without abnormal findings: Secondary | ICD-10-CM

## 2012-09-16 ENCOUNTER — Other Ambulatory Visit (INDEPENDENT_AMBULATORY_CARE_PROVIDER_SITE_OTHER): Payer: BC Managed Care – PPO

## 2012-09-16 DIAGNOSIS — Z0000 Encounter for general adult medical examination without abnormal findings: Secondary | ICD-10-CM

## 2012-09-16 LAB — COMPREHENSIVE METABOLIC PANEL
ALT: 26 U/L (ref 0–35)
AST: 26 U/L (ref 0–37)
Albumin: 4 g/dL (ref 3.5–5.2)
Alkaline Phosphatase: 57 U/L (ref 39–117)
Glucose, Bld: 100 mg/dL — ABNORMAL HIGH (ref 70–99)
Potassium: 4.3 mEq/L (ref 3.5–5.1)
Sodium: 139 mEq/L (ref 135–145)
Total Bilirubin: 0.6 mg/dL (ref 0.3–1.2)
Total Protein: 7.4 g/dL (ref 6.0–8.3)

## 2012-09-16 LAB — URINALYSIS, ROUTINE W REFLEX MICROSCOPIC
Hgb urine dipstick: NEGATIVE
Leukocytes, UA: NEGATIVE
Specific Gravity, Urine: >=1.03 (ref 1.000–1.030)
Urine Glucose: NEGATIVE
Urobilinogen, UA: 0.2 (ref 0.0–1.0)

## 2012-09-16 LAB — CBC WITH DIFFERENTIAL/PLATELET
Basophils Absolute: 0 10*3/uL (ref 0.0–0.1)
Eosinophils Absolute: 0.3 10*3/uL (ref 0.0–0.7)
Eosinophils Relative: 4.2 % (ref 0.0–5.0)
HCT: 42.1 % (ref 36.0–46.0)
Lymphs Abs: 2.4 10*3/uL (ref 0.7–4.0)
MCV: 92 fl (ref 78.0–100.0)
Monocytes Absolute: 0.6 10*3/uL (ref 0.1–1.0)
Neutrophils Relative %: 53.2 % (ref 43.0–77.0)
Platelets: 381 10*3/uL (ref 150.0–400.0)
RDW: 12.8 % (ref 11.5–14.6)
WBC: 7.1 10*3/uL (ref 4.5–10.5)

## 2012-09-16 LAB — TSH: TSH: 1.05 u[IU]/mL (ref 0.35–5.50)

## 2012-09-16 LAB — LIPID PANEL
Cholesterol: 205 mg/dL — ABNORMAL HIGH (ref 0–200)
Total CHOL/HDL Ratio: 4
VLDL: 49.8 mg/dL — ABNORMAL HIGH (ref 0.0–40.0)

## 2012-09-23 ENCOUNTER — Ambulatory Visit (INDEPENDENT_AMBULATORY_CARE_PROVIDER_SITE_OTHER): Payer: BC Managed Care – PPO | Admitting: Internal Medicine

## 2012-09-23 ENCOUNTER — Encounter: Payer: Self-pay | Admitting: Internal Medicine

## 2012-09-23 VITALS — BP 158/80 | HR 80 | Temp 99.0°F | Resp 16 | Ht 68.0 in | Wt 192.0 lb

## 2012-09-23 DIAGNOSIS — K5732 Diverticulitis of large intestine without perforation or abscess without bleeding: Secondary | ICD-10-CM

## 2012-09-23 DIAGNOSIS — K5900 Constipation, unspecified: Secondary | ICD-10-CM | POA: Insufficient documentation

## 2012-09-23 DIAGNOSIS — R0789 Other chest pain: Secondary | ICD-10-CM

## 2012-09-23 DIAGNOSIS — Z23 Encounter for immunization: Secondary | ICD-10-CM

## 2012-09-23 DIAGNOSIS — Z0000 Encounter for general adult medical examination without abnormal findings: Secondary | ICD-10-CM

## 2012-09-23 DIAGNOSIS — J45909 Unspecified asthma, uncomplicated: Secondary | ICD-10-CM

## 2012-09-23 DIAGNOSIS — K5792 Diverticulitis of intestine, part unspecified, without perforation or abscess without bleeding: Secondary | ICD-10-CM

## 2012-09-23 MED ORDER — LINACLOTIDE 290 MCG PO CAPS: 1.0000 | ORAL_CAPSULE | Freq: Every day | ORAL | Status: DC

## 2012-09-23 MED ORDER — ZOLPIDEM TARTRATE 5 MG PO TABS: 5.0000 mg | ORAL_TABLET | Freq: Every evening | ORAL | Status: DC | PRN

## 2012-09-23 MED ORDER — ALBUTEROL SULFATE HFA 108 (90 BASE) MCG/ACT IN AERS: 2.0000 | INHALATION_SPRAY | RESPIRATORY_TRACT | Status: DC | PRN

## 2012-09-23 NOTE — Assessment & Plan Note (Signed)
8/13 acute x 2 wks ?etiol Dob ECHO was nl

## 2012-09-23 NOTE — Assessment & Plan Note (Signed)
Will try Linzess prn 

## 2012-09-23 NOTE — Progress Notes (Signed)
   Subjective:    HPI  The patient is here for a wellness exam. The patient has been doing well overall without major physical or psychological issues going on lately. The patient needs to address  chronic OA that has been well controlled with medicines; to address chronic constipation controlled with medical treatment and diet.   BP Readings from Last 3 Encounters:  09/23/12 158/80  06/09/12 131/82  05/22/12 120/76   Wt Readings from Last 3 Encounters:  09/23/12 192 lb (87.091 kg)  06/09/12 188 lb (85.276 kg)  05/22/12 186 lb 12.8 oz (84.732 kg)       Review of Systems  Constitutional: Negative for chills, activity change, appetite change, fatigue and unexpected weight change.  HENT: Negative for congestion, mouth sores and sinus pressure.   Eyes: Negative for visual disturbance.  Respiratory: Negative for chest tightness and wheezing.   Genitourinary: Negative for frequency, difficulty urinating and vaginal pain.  Musculoskeletal: Negative for gait problem.  Skin: Negative for pallor and rash.  Neurological: Negative for tremors.  Psychiatric/Behavioral: Negative for confusion and sleep disturbance.       Objective:   Physical Exam  Constitutional: She appears well-developed. No distress.       Obese   HENT:  Head: Normocephalic.  Right Ear: External ear normal.  Left Ear: External ear normal.  Nose: Nose normal.  Mouth/Throat: Oropharynx is clear and moist.  Eyes: Conjunctivae normal are normal. Pupils are equal, round, and reactive to light. Right eye exhibits no discharge. Left eye exhibits no discharge.  Neck: Normal range of motion. Neck supple. No JVD present. No tracheal deviation present. No thyromegaly present.  Cardiovascular: Normal rate, regular rhythm and normal heart sounds.   Pulmonary/Chest: No stridor. No respiratory distress. She has no wheezes. She exhibits no tenderness.  Abdominal: Soft. Bowel sounds are normal. She exhibits no distension and  no mass. There is tenderness. There is no rebound and no guarding.       epig is tender  Musculoskeletal: She exhibits no edema and no tenderness.  Lymphadenopathy:    She has no cervical adenopathy.  Neurological: She displays normal reflexes. No cranial nerve deficit. She exhibits normal muscle tone. Coordination normal.  Skin: No rash noted. No erythema.  Psychiatric: She has a normal mood and affect. Her behavior is normal. Judgment and thought content normal.    Lab Results  Component Value Date   WBC 7.1 09/16/2012   HGB 14.4 09/16/2012   HCT 42.1 09/16/2012   PLT 381.0 09/16/2012   GLUCOSE 100* 09/16/2012   CHOL 205* 09/16/2012   TRIG 249.0* 09/16/2012   HDL 46.80 09/16/2012   LDLDIRECT 106.2 09/16/2012   LDLCALC 116* 07/06/2008   ALT 26 09/16/2012   AST 26 09/16/2012   NA 139 09/16/2012   K 4.3 09/16/2012   CL 103 09/16/2012   CREATININE 0.8 09/16/2012   BUN 18 09/16/2012   CO2 27 09/16/2012   TSH 1.05 09/16/2012   HGBA1C 5.6 08/05/2007          Assessment & Plan:

## 2012-09-23 NOTE — Assessment & Plan Note (Signed)
Continue with current prescription therapy as reflected on the Med list.  

## 2012-09-23 NOTE — Assessment & Plan Note (Addendum)
We discussed age appropriate health related issues, including available/recomended screening tests and vaccinations. We discussed a need for adhering to healthy diet and exercise. Labs/EKG were reviewed/ordered. All questions were answered. Pneumovax Mammo  Watch BP

## 2012-09-23 NOTE — Assessment & Plan Note (Signed)
Surgical options discussed Abx prn

## 2012-10-22 ENCOUNTER — Other Ambulatory Visit: Payer: Self-pay | Admitting: Internal Medicine

## 2013-01-03 ENCOUNTER — Emergency Department (HOSPITAL_COMMUNITY)
Admission: EM | Admit: 2013-01-03 | Discharge: 2013-01-03 | Disposition: A | Discharge status: Home / Self Care | Payer: BC Managed Care – PPO | Attending: Emergency Medicine | Admitting: Emergency Medicine

## 2013-01-03 ENCOUNTER — Encounter (HOSPITAL_COMMUNITY): Payer: Self-pay

## 2013-01-03 DIAGNOSIS — Z8639 Personal history of other endocrine, nutritional and metabolic disease: Secondary | ICD-10-CM | POA: Insufficient documentation

## 2013-01-03 DIAGNOSIS — Y92009 Unspecified place in unspecified non-institutional (private) residence as the place of occurrence of the external cause: Secondary | ICD-10-CM | POA: Insufficient documentation

## 2013-01-03 DIAGNOSIS — Z9181 History of falling: Secondary | ICD-10-CM | POA: Insufficient documentation

## 2013-01-03 DIAGNOSIS — Z860100 Personal history of colon polyps, unspecified: Secondary | ICD-10-CM | POA: Insufficient documentation

## 2013-01-03 DIAGNOSIS — J45909 Unspecified asthma, uncomplicated: Secondary | ICD-10-CM | POA: Insufficient documentation

## 2013-01-03 DIAGNOSIS — K5732 Diverticulitis of large intestine without perforation or abscess without bleeding: Secondary | ICD-10-CM | POA: Insufficient documentation

## 2013-01-03 DIAGNOSIS — Y9389 Activity, other specified: Secondary | ICD-10-CM | POA: Insufficient documentation

## 2013-01-03 DIAGNOSIS — Z8739 Personal history of other diseases of the musculoskeletal system and connective tissue: Secondary | ICD-10-CM | POA: Insufficient documentation

## 2013-01-03 DIAGNOSIS — K219 Gastro-esophageal reflux disease without esophagitis: Secondary | ICD-10-CM | POA: Insufficient documentation

## 2013-01-03 DIAGNOSIS — R109 Unspecified abdominal pain: Secondary | ICD-10-CM | POA: Insufficient documentation

## 2013-01-03 DIAGNOSIS — K5792 Diverticulitis of intestine, part unspecified, without perforation or abscess without bleeding: Secondary | ICD-10-CM

## 2013-01-03 DIAGNOSIS — L237 Allergic contact dermatitis due to plants, except food: Secondary | ICD-10-CM

## 2013-01-03 DIAGNOSIS — J029 Acute pharyngitis, unspecified: Secondary | ICD-10-CM | POA: Insufficient documentation

## 2013-01-03 DIAGNOSIS — L255 Unspecified contact dermatitis due to plants, except food: Secondary | ICD-10-CM | POA: Insufficient documentation

## 2013-01-03 DIAGNOSIS — Z79899 Other long term (current) drug therapy: Secondary | ICD-10-CM | POA: Insufficient documentation

## 2013-01-03 DIAGNOSIS — Z862 Personal history of diseases of the blood and blood-forming organs and certain disorders involving the immune mechanism: Secondary | ICD-10-CM | POA: Insufficient documentation

## 2013-01-03 DIAGNOSIS — T622X1A Toxic effect of other ingested (parts of) plant(s), accidental (unintentional), initial encounter: Secondary | ICD-10-CM | POA: Insufficient documentation

## 2013-01-03 LAB — COMPREHENSIVE METABOLIC PANEL
ALT: 17 U/L (ref 0–35)
AST: 15 U/L (ref 0–37)
Alkaline Phosphatase: 70 U/L (ref 39–117)
CO2: 29 mEq/L (ref 19–32)
Chloride: 100 mEq/L (ref 96–112)
GFR calc non Af Amer: 89 mL/min — ABNORMAL LOW (ref >90)
Sodium: 138 mEq/L (ref 135–145)
Total Bilirubin: 0.6 mg/dL (ref 0.3–1.2)

## 2013-01-03 LAB — URINALYSIS, ROUTINE W REFLEX MICROSCOPIC
Bilirubin Urine: NEGATIVE
Hgb urine dipstick: NEGATIVE
Protein, ur: NEGATIVE mg/dL
Urobilinogen, UA: 0.2 mg/dL (ref 0.0–1.0)

## 2013-01-03 LAB — CBC WITH DIFFERENTIAL/PLATELET
Basophils Absolute: 0 10*3/uL (ref 0.0–0.1)
HCT: 38.7 % (ref 36.0–46.0)
Hemoglobin: 13 g/dL (ref 12.0–15.0)
Lymphocytes Relative: 20 % (ref 12–46)
Lymphs Abs: 2.4 10*3/uL (ref 0.7–4.0)
MCH: 31 pg (ref 26.0–34.0)
Monocytes Absolute: 1.5 10*3/uL — ABNORMAL HIGH (ref 0.1–1.0)
Monocytes Relative: 12 % (ref 3–12)
Neutro Abs: 7.7 10*3/uL (ref 1.7–7.7)
Platelets: 414 10*3/uL — ABNORMAL HIGH (ref 150–400)
RBC: 4.2 MIL/uL (ref 3.87–5.11)
RDW: 12.9 % (ref 11.5–15.5)
WBC: 11.9 10*3/uL — ABNORMAL HIGH (ref 4.0–10.5)

## 2013-01-03 LAB — RAPID STREP SCREEN (MED CTR MEBANE ONLY): Streptococcus, Group A Screen (Direct): NEGATIVE

## 2013-01-03 MED ORDER — CIPROFLOXACIN HCL 500 MG PO TABS: 500.0000 mg | ORAL_TABLET | Freq: Two times a day (BID) | ORAL | Status: DC

## 2013-01-03 MED ORDER — HYDROCODONE-ACETAMINOPHEN 5-325 MG PO TABS: ORAL_TABLET | ORAL | Status: DC

## 2013-01-03 MED ORDER — SODIUM CHLORIDE 0.9 % IV SOLN
INTRAVENOUS | Status: DC
  Administered 2013-01-03: 09:00:00 via INTRAVENOUS

## 2013-01-03 MED ORDER — MORPHINE SULFATE 4 MG/ML IJ SOLN
INTRAMUSCULAR | Status: AC
  Administered 2013-01-03: 4 mg
  Filled 2013-01-03: qty 1

## 2013-01-03 MED ORDER — METRONIDAZOLE IN NACL 5-0.79 MG/ML-% IV SOLN
500.0000 mg | Freq: Once | INTRAVENOUS | Status: AC
  Administered 2013-01-03: 500 mg via INTRAVENOUS
  Filled 2013-01-03: qty 100

## 2013-01-03 MED ORDER — MORPHINE SULFATE 4 MG/ML IJ SOLN
4.0000 mg | Freq: Once | INTRAMUSCULAR | Status: AC
  Administered 2013-01-03: 4 mg via INTRAVENOUS
  Filled 2013-01-03: qty 1

## 2013-01-03 MED ORDER — METHYLPREDNISOLONE SODIUM SUCC 125 MG IJ SOLR
125.0000 mg | Freq: Once | INTRAMUSCULAR | Status: AC
  Administered 2013-01-03: 125 mg via INTRAMUSCULAR
  Filled 2013-01-03: qty 2

## 2013-01-03 MED ORDER — ONDANSETRON 8 MG PO TBDP: 8.0000 mg | ORAL_TABLET | Freq: Three times a day (TID) | ORAL | Status: DC | PRN

## 2013-01-03 MED ORDER — METRONIDAZOLE 500 MG PO TABS: 500.0000 mg | ORAL_TABLET | Freq: Four times a day (QID) | ORAL | Status: DC

## 2013-01-03 MED ORDER — CIPROFLOXACIN IN D5W 400 MG/200ML IV SOLN
400.0000 mg | Freq: Once | INTRAVENOUS | Status: AC
  Administered 2013-01-03: 400 mg via INTRAVENOUS
  Filled 2013-01-03: qty 200

## 2013-01-03 MED ORDER — ONDANSETRON HCL 4 MG/2ML IJ SOLN
4.0000 mg | Freq: Once | INTRAMUSCULAR | Status: AC
  Administered 2013-01-03: 4 mg via INTRAVENOUS
  Filled 2013-01-03: qty 2

## 2013-01-03 MED ORDER — MORPHINE SULFATE 4 MG/ML IJ SOLN: 4.0000 mg | Freq: Once | INTRAMUSCULAR | Status: DC

## 2013-01-03 NOTE — ED Notes (Signed)
Sheila Hall (husband) number is [id number].

## 2013-01-03 NOTE — ED Provider Notes (Signed)
History     CSN: [id number]  Arrival date & time 01/03/13  0740   First MD Initiated Contact with Patient 01/03/13 0750      Chief Complaint  Patient presents with  . Abdominal Pain  . Sore Throat  . Fever    (Consider location/radiation/quality/duration/timing/severity/associated sxs/prior treatment) HPI  Patient presents for 3 separate complaints. Her first complaint is that she was working in the yard last weekend and the first day started getting poison ivy on her wrist which has spread to her other wrist and now on her left knee. She reports she normally gets an injection of steroids for her poison ivy.  She also reports yesterday morning she started having sore throat and states it's painful to swallow. She states she feels like her throat is swollen inside. Her husband also feels like her neck is swollen. She states it's very difficult to drink. She is concerned she may have poison ivy in her throat.  Patient has history of diverticulitis and states she gets it 2 or 3 times a year. The last time was last fall when she had to be admitted. She states last fall was one of her worst episodes and she had pain in her left lower quadrant but also spread to her right lower quadrant. She states at that admission they discussed possible surgery in the future. She states yesterday morning she started getting left lower quadrant pain which is like her diverticulitis she's had in the past. She states the pain is constant and aching. She has had nausea and vomiting once. She denies diarrhea or constipation. She thinks she may have had a low-grade fever at home. She states sometimes walking makes her hurt more and she did use a heating pad on it last night which seemed to help. She states she normally gets Flagyl and Cipro for antibiotics. However when she was in the hospital this fall they started her on cipro and flagyl but then  put her on Augmentin.  PCP Dr Posey Rea GI Dr Russella Dar  Past Medical  History  Diagnosis Date  . ASTHMA 08/12/2007    seasonal  . DISORDER, CERVICAL DISC W/MYELOPATHY 05/19/2007  . Edema 03/10/2009  . GERD 04/13/2007  . Headache 02/15/2010  . HYPERLIPIDEMIA 04/13/2007  . OSTEOPENIA 04/13/2007  . OSTEOPOROSIS 05/23/2007  . SYNCOPE 02/15/2010  . History of colon polyps 08/14/2010  . Diverticulosis 08/14/2010    Past Surgical History  Procedure Laterality Date  . Abdominal hysterectomy    . Appendectomy    . Cholecystectomy    . Tubal ligation    . Cervical laminectomy    . Lumbar disc surgery    . Tonsillectomy    . Other surgical history      left lower arm skin cryo - benign    Family History  Problem Relation Age of Onset  . Colon cancer Father 36  . Cancer Father 58    bladder, prostate  . Diabetes Brother   . Cancer Mother 40    pancreatic ca  . Heart disease Sister 59    CAD  . Breast cancer Maternal Aunt   . Colon polyps      sistersx2, brothersx2  . Kidney disease Brother     History  Substance Use Topics  . Smoking status: Never Smoker   . Smokeless tobacco: Never Used  . Alcohol Use: No   lives at home Lives with spouse  OB History   Grav Para Term Preterm Abortions TAB SAB  Ect Mult Living                  Review of Systems  All other systems reviewed and are negative.    Allergies  Codeine sulfate  Home Medications   Current Outpatient Rx  Name  Route  Sig  Dispense  Refill  . albuterol (PROVENTIL HFA;VENTOLIN HFA) 108 (90 BASE) MCG/ACT inhaler   Inhalation   Inhale 2 puffs into the lungs every 4 (four) hours as needed for wheezing.         . docusate sodium (COLACE) 100 MG capsule   Oral   Take 100 mg by mouth at bedtime.         Marland Kitchen estradiol (ESTRACE) 1 MG tablet   Oral   Take 0.5 mg by mouth daily.         . hydrOXYzine (ATARAX/VISTARIL) 50 MG tablet   Oral   Take 50 mg by mouth at bedtime as needed for itching (or sleep).         Marland Kitchen omeprazole (PRILOSEC) 40 MG capsule   Oral   Take 40 mg by mouth  daily.         . traMADol (ULTRAM) 50 MG tablet   Oral   Take 50 mg by mouth every 6 (six) hours as needed for pain.          Marland Kitchen zolpidem (AMBIEN) 5 MG tablet   Oral   Take 5 mg by mouth at bedtime as needed for sleep.         . Linaclotide 290 MCG CAPS   Oral   Take 1 capsule by mouth daily.   30 capsule   11     BP 146/71  Pulse 77  Temp(Src) 98.9 F (37.2 C) (Oral)  Resp 16  Ht 5\' 8"  (1.727 m)  Wt 190 lb (86.183 kg)  BMI 28.9 kg/m2  SpO2 100%  Vital signs normal    Physical Exam  Nursing note and vitals reviewed. Constitutional: She is oriented to person, place, and time. She appears well-developed and well-nourished.  Non-toxic appearance. She does not appear ill. No distress.  HENT:  Head: Normocephalic and atraumatic.  Right Ear: External ear normal.  Left Ear: External ear normal.  Nose: Nose normal. No mucosal edema or rhinorrhea.  Mouth/Throat: Oropharynx is clear and moist and mucous membranes are normal. No dental abscesses or edematous.  Mild redness of the posterior pharynx. Tonsils are not present. I do not see any ulcerative lesions or pustular lesions. There is no swelling in the posterior pharynx.  Eyes: Conjunctivae and EOM are normal. Pupils are equal, round, and reactive to light.  Neck: Normal range of motion and full passive range of motion without pain. Neck supple.  Cardiovascular: Normal rate, regular rhythm and normal heart sounds.  Exam reveals no gallop and no friction rub.   No murmur heard. Pulmonary/Chest: Effort normal and breath sounds normal. No respiratory distress. She has no wheezes. She has no rhonchi. She has no rales. She exhibits no tenderness and no crepitus.  Abdominal: Soft. Normal appearance and bowel sounds are normal. She exhibits no distension. There is tenderness. There is no rebound and no guarding.    Active bowel sounds  Musculoskeletal: Normal range of motion. She exhibits no edema and no tenderness.  Moves  all extremities well.   Lymphadenopathy:    She has no cervical adenopathy.  Neurological: She is alert and oriented to person, place, and time. She has normal  strength. No cranial nerve deficit.  Skin: Skin is warm, dry and intact. Rash noted. No erythema. No pallor.  Patient is noted to have small clumps of clear fluid filled vesicles on the volar aspect of her right wrist, her right ring finger scattered on her right hand, and a very small patch on her medial lateral knee near the flexural crease  Psychiatric: She has a normal mood and affect. Her speech is normal and behavior is normal. Her mood appears not anxious.    ED Course  Procedures (including critical care time) Medications  morphine 4 MG/ML injection 4 mg (4 mg Intravenous Given 01/03/13 0845)  ondansetron (ZOFRAN) injection 4 mg (4 mg Intravenous Given 01/03/13 0845)  ciprofloxacin (CIPRO) IVPB 400 mg (0 mg Intravenous Stopped 01/03/13 1010)  metroNIDAZOLE (FLAGYL) IVPB 500 mg (0 mg Intravenous Stopped 01/03/13 1158)  methylPREDNISolone sodium succinate (SOLU-MEDROL) 125 mg/2 mL injection 125 mg (125 mg Intramuscular Given 01/03/13 0857)  morphine 4 MG/ML injection (4 mg  Given 01/03/13 1144)   09:30 feeling better, wanting to drink liquids.  10:00 nurse reports she vomited once.   Pt has been able to drink fluids again, she feels ready to go home. She wants to start on the cipro and flagyl rather than her augmentin and will f/u with Dr Russella Dar tomorrow.   Results for orders placed during the hospital encounter of 01/03/13  RAPID STREP SCREEN      Result Value Range   Streptococcus, Group A Screen (Direct) NEGATIVE  NEGATIVE  CBC WITH DIFFERENTIAL      Result Value Range   WBC 11.9 (*) 4.0 - 10.5 K/uL   RBC 4.20  3.87 - 5.11 MIL/uL   Hemoglobin 13.0  12.0 - 15.0 g/dL   HCT 40.9  81.1 - 91.4 %   MCV 92.1  78.0 - 100.0 fL   MCH 31.0  26.0 - 34.0 pg   MCHC 33.6  30.0 - 36.0 g/dL   RDW 78.2  95.6 - 21.3 %   Platelets 414 (*)  150 - 400 K/uL   Neutrophils Relative 65  43 - 77 %   Neutro Abs 7.7  1.7 - 7.7 K/uL   Lymphocytes Relative 20  12 - 46 %   Lymphs Abs 2.4  0.7 - 4.0 K/uL   Monocytes Relative 12  3 - 12 %   Monocytes Absolute 1.5 (*) 0.1 - 1.0 K/uL   Eosinophils Relative 2  0 - 5 %   Eosinophils Absolute 0.3  0.0 - 0.7 K/uL   Basophils Relative 0  0 - 1 %   Basophils Absolute 0.0  0.0 - 0.1 K/uL  COMPREHENSIVE METABOLIC PANEL      Result Value Range   Sodium 138  135 - 145 mEq/L   Potassium 3.8  3.5 - 5.1 mEq/L   Chloride 100  96 - 112 mEq/L   CO2 29  19 - 32 mEq/L   Glucose, Bld 105 (*) 70 - 99 mg/dL   BUN 21  6 - 23 mg/dL   Creatinine, Ser 0.86  0.50 - 1.10 mg/dL   Calcium 8.7  8.4 - 57.8 mg/dL   Total Protein 7.1  6.0 - 8.3 g/dL   Albumin 3.7  3.5 - 5.2 g/dL   AST 15  0 - 37 U/L   ALT 17  0 - 35 U/L   Alkaline Phosphatase 70  39 - 117 U/L   Total Bilirubin 0.6  0.3 - 1.2 mg/dL   GFR  calc non Af Amer 89 (*) >90 mL/min   GFR calc Af Amer >90  >90 mL/min  URINALYSIS, ROUTINE W REFLEX MICROSCOPIC      Result Value Range   Color, Urine YELLOW  YELLOW   APPearance CLOUDY (*) CLEAR   Specific Gravity, Urine 1.031 (*) 1.005 - 1.030   pH 5.5  5.0 - 8.0   Glucose, UA NEGATIVE  NEGATIVE mg/dL   Hgb urine dipstick NEGATIVE  NEGATIVE   Bilirubin Urine NEGATIVE  NEGATIVE   Ketones, ur NEGATIVE  NEGATIVE mg/dL   Protein, ur NEGATIVE  NEGATIVE mg/dL   Urobilinogen, UA 0.2  0.0 - 1.0 mg/dL   Nitrite NEGATIVE  NEGATIVE   Leukocytes, UA NEGATIVE  NEGATIVE   Vital signs normal except for concentrated urine, leukocytosis       1. Diverticulitis   2. Contact dermatitis due to poison ivy   3. Sore throat     Discharge medications Cipro Flagyl Norco Zofran   Devoria Albe, MD, FACEP   MDM  patient with prior history of diverticulitis presents with similar symptoms. She has no symptoms or physical exam concerning for complication such as abscess or perforation. Patient wants to be treated as  an outpatient and will followup with her gastroenterologist tomorrow.           Ward Givens, MD 01/03/13 1650

## 2013-01-03 NOTE — ED Notes (Signed)
EDP Iva Knap in with this pt before this RN.  Pt presents with NAD

## 2013-01-04 ENCOUNTER — Telehealth: Payer: Self-pay | Admitting: Gastroenterology

## 2013-01-04 NOTE — Telephone Encounter (Signed)
Patient was started on cipro and flagyl for 10 days.  She is aware to call back if her symptoms fail to improve or worsen prior to her follow up appt on 01/13/13 11:15

## 2013-01-07 LAB — HM MAMMOGRAPHY

## 2013-01-08 ENCOUNTER — Encounter: Payer: Self-pay | Admitting: Internal Medicine

## 2013-01-08 ENCOUNTER — Ambulatory Visit (INDEPENDENT_AMBULATORY_CARE_PROVIDER_SITE_OTHER): Payer: BC Managed Care – PPO | Admitting: Internal Medicine

## 2013-01-08 VITALS — BP 122/80 | HR 65 | Temp 97.9°F | Ht 68.0 in | Wt 194.5 lb

## 2013-01-08 DIAGNOSIS — R109 Unspecified abdominal pain: Secondary | ICD-10-CM

## 2013-01-08 DIAGNOSIS — J309 Allergic rhinitis, unspecified: Secondary | ICD-10-CM

## 2013-01-08 DIAGNOSIS — L255 Unspecified contact dermatitis due to plants, except food: Secondary | ICD-10-CM

## 2013-01-08 DIAGNOSIS — K5792 Diverticulitis of intestine, part unspecified, without perforation or abscess without bleeding: Secondary | ICD-10-CM

## 2013-01-08 DIAGNOSIS — K5732 Diverticulitis of large intestine without perforation or abscess without bleeding: Secondary | ICD-10-CM

## 2013-01-08 DIAGNOSIS — L237 Allergic contact dermatitis due to plants, except food: Secondary | ICD-10-CM

## 2013-01-08 DIAGNOSIS — J029 Acute pharyngitis, unspecified: Secondary | ICD-10-CM

## 2013-01-08 MED ORDER — PREDNISONE 10 MG PO TABS: ORAL_TABLET | ORAL | Status: DC

## 2013-01-08 MED ORDER — HYOSCYAMINE SULFATE 0.125 MG PO TABS: 0.1250 mg | ORAL_TABLET | ORAL | Status: DC | PRN

## 2013-01-08 NOTE — Assessment & Plan Note (Signed)
Start zyrtec OTC Use saline eye drops for irritation if needed

## 2013-01-08 NOTE — Assessment & Plan Note (Signed)
Continue cipro/flagyl Hyosycamine for cramping Follow up with GI

## 2013-01-08 NOTE — Progress Notes (Signed)
HPI  Pt presents to the clinic today with c/o cold symptoms sore throat x 4 days and poison ivy on her arms. She was seen in the ER for the same. Strep test was negative but her throat is so sore she cannot even swallow. She has not been drooling or choking on her food. She has taken Aleve for the pain. They did give her a steroid shot in the ER for her poison ivy. This did help with the itching and the sore throat but once the medication wore off, the itch and sore throat came back. She denies fever, chills or body aches. She has not drank anything hot that would have burned her throat. Additionally, she was evaluated in the ER for diverticulitis. She was started on Cipro and Flagyl but was not given anything for stomach cramps. She is requesting an RX today. She follows up with GI next week.  Review of Systems      Past Medical History  Diagnosis Date  . ASTHMA 08/12/2007    seasonal  . DISORDER, CERVICAL DISC W/MYELOPATHY 05/19/2007  . Edema 03/10/2009  . GERD 04/13/2007  . Headache 02/15/2010  . HYPERLIPIDEMIA 04/13/2007  . OSTEOPENIA 04/13/2007  . OSTEOPOROSIS 05/23/2007  . SYNCOPE 02/15/2010  . History of colon polyps 08/14/2010  . Diverticulosis 08/14/2010    Family History  Problem Relation Age of Onset  . Colon cancer Father 44  . Cancer Father 39    bladder, prostate  . Diabetes Brother   . Cancer Mother 39    pancreatic ca  . Heart disease Sister 60    CAD  . Breast cancer Maternal Aunt   . Colon polyps      sistersx2, brothersx2  . Kidney disease Brother     History   Social History  . Marital Status: Married    Spouse Name: N/A    Number of Children: 2  . Years of Education: N/A   Occupational History  . PLANNING    Social History Main Topics  . Smoking status: Never Smoker   . Smokeless tobacco: Never Used  . Alcohol Use: No  . Drug Use: No  . Sexually Active: Yes   Other Topics Concern  . Not on file   Social History Narrative  . No narrative on file     Allergies  Allergen Reactions  . Codeine Sulfate     REACTION: nausea     Constitutional:  Denies headache, fatigue, fever or abrupt weight changes.  HEENT:  Positive sore throat. Denies eye redness, eye pain, pressure behind the eyes, facial pain, nasal congestion, ear pain, ringing in the ears, wax buildup, runny nose or bloody nose. Respiratory: Denies cough, difficulty breathing or shortness of breath.  Cardiovascular: Denies chest pain, chest tightness, palpitations or swelling in the hands or feet.  Skin: pt reports poison ivy of arms GI: Pt reports abdominal cramps. Denies nausea, vomiting, constipation or blood in stool.  No other specific complaints in a complete review of systems (except as listed in HPI above).  Objective:   BP 122/80  Pulse 65  Temp(Src) 97.9 F (36.6 C) (Oral)  Ht 5\' 8"  (1.727 m)  Wt 194 lb 8 oz (88.225 kg)  BMI 29.58 kg/m2  SpO2 97% Wt Readings from Last 3 Encounters:  01/08/13 194 lb 8 oz (88.225 kg)  01/03/13 190 lb (86.183 kg)  09/23/12 192 lb (87.091 kg)     General: Appears her stated age, well developed, well nourished in NAD.  Skin: Poison ivy noted on bilateral forearms. No lesions or ulcerations noted. HEENT: Head: normal shape and size; Eyes: sclera injected, no icterus, conjunctiva pink, PERRLA and EOMs intact; Ears: Tm's gray and intact, normal light reflex; Nose: mucosa boggy and moist, septum midline; Throat/Mouth: + PND. Teeth present, mucosa erythematous and moist, no exudate noted, no lesions or ulcerations noted.  Neck: Mild tonsillar lymphadenopathy. Neck supple, trachea midline. No massses, lumps or thyromegaly present.  Cardiovascular: Normal rate and rhythm. S1,S2 noted.  No murmur, rubs or gallops noted. No JVD or BLE edema. No carotid bruits noted. Pulmonary/Chest: Normal effort and positive vesicular breath sounds. No respiratory distress. No wheezes, rales or ronchi noted.  Abdomen: Soft and tender in the LLQ. Normal  bowel sounds. No distention or masses noted. Liver, spleen and kidneys non palpable.     Assessment & Plan:   Sore throat, likely viral:  Get some rest and drink plenty of water Do salt water gargles for the sore throat eRx for pred taper  Poison Ivy, new onset:  Take Benadryl as needed Can apply calamine lotion to the skin eRx for pred taper  RTC as needed or if symptoms persist.

## 2013-01-08 NOTE — Patient Instructions (Signed)
Viral Pharyngitis  Viral pharyngitis is a viral infection that produces redness, pain, and swelling (inflammation) of the throat. It can spread from person to person (contagious).  CAUSES  Viral pharyngitis is caused by inhaling a large amount of certain germs called viruses. Many different viruses cause viral pharyngitis.  SYMPTOMS  Symptoms of viral pharyngitis include:   Sore throat.   Tiredness.   Stuffy nose.   Low-grade fever.   Congestion.   Cough.  TREATMENT  Treatment includes rest, drinking plenty of fluids, and the use of over-the-counter medication (approved by your caregiver).  HOME CARE INSTRUCTIONS    Drink enough fluids to keep your urine clear or pale yellow.   Eat soft, cold foods such as ice cream, frozen ice pops, or gelatin dessert.   Gargle with warm salt water (1 tsp salt per 1 qt of water).   If over age 7, throat lozenges may be used safely.   Only take over-the-counter or prescription medicines for pain, discomfort, or fever as directed by your caregiver. Do not take aspirin.  To help prevent spreading viral pharyngitis to others, avoid:   Mouth-to-mouth contact with others.   Sharing utensils for eating and drinking.   Coughing around others.  SEEK MEDICAL CARE IF:    You are better in a few days, then become worse.   You have a fever or pain not helped by pain medicines.   There are any other changes that concern you.  Document Released: 06/05/2005 Document Revised: 11/18/2011 Document Reviewed: 11/01/2010  ExitCare Patient Information 2013 [employer].

## 2013-01-13 ENCOUNTER — Ambulatory Visit (INDEPENDENT_AMBULATORY_CARE_PROVIDER_SITE_OTHER): Payer: BC Managed Care – PPO | Admitting: Gastroenterology

## 2013-01-13 ENCOUNTER — Encounter: Payer: Self-pay | Admitting: Gastroenterology

## 2013-01-13 VITALS — BP 130/80 | HR 60 | Ht 68.0 in | Wt 191.5 lb

## 2013-01-13 DIAGNOSIS — K5731 Diverticulosis of large intestine without perforation or abscess with bleeding: Secondary | ICD-10-CM

## 2013-01-13 DIAGNOSIS — Z86010 Personal history of colonic polyps: Secondary | ICD-10-CM

## 2013-01-13 DIAGNOSIS — K5900 Constipation, unspecified: Secondary | ICD-10-CM

## 2013-01-13 DIAGNOSIS — Z800 Family history of malignant neoplasm of digestive organs: Secondary | ICD-10-CM

## 2013-01-13 MED ORDER — HYDROCORTISONE 2.5 % RE CREA: TOPICAL_CREAM | Freq: Two times a day (BID) | RECTAL | Status: DC

## 2013-01-13 NOTE — Progress Notes (Signed)
History of Present Illness: This is a 62 year old female with history of recurrent diverticulitis. She was seen in the emergency department about 2 weeks ago with acute left lower coronary pain a mildly elevated white count and left lower quadrant tenderness, all findings consistent with a recurrent episode of diverticulitis. She was treated with a course of Cipro and Flagyl and her symptoms have resolved. She has ongoing difficulties with constipation and occasionally notes left lower quadrant and epigastric discomfort. Often the symptoms are relieved by bowel movements occasionally her symptoms are relieved by the use of hyoscyamine.  Current Medications, Allergies, Past Medical History, Past Surgical History, Family History and Social History were reviewed in [employer] record.  Physical Exam: General: Well developed , well nourished, no acute distress Head: Normocephalic and atraumatic Eyes:  sclerae anicteric, EOMI Ears: Normal auditory acuity Mouth: No deformity or lesions Lungs: Clear throughout to auscultation Heart: Regular rate and rhythm; no murmurs, rubs or bruits Abdomen: Soft, non tender and non distended. No masses, hepatosplenomegaly or hernias noted. Normal Bowel sounds Musculoskeletal: Symmetrical with no gross deformities  Pulses:  Normal pulses noted Extremities: No clubbing, cyanosis, edema or deformities noted Neurological: Alert oriented x 4, grossly nonfocal Psychological:  Alert and cooperative. Normal mood and affect  Assessment and Recommendations:  1. Recurrent diverticulitis. Constipation. We discussed the possibility of elective surgery if her recurrent episodes persist or become more severe. For now she is not interested in pursuing surgery. Increase dietary fiber and water intake. Increase Colace to twice a day. If her constipation is persistently problematic begin assess on a daily basis.  2. Personal history of adenomatous colon polyps  and a first degree family member with colon cancer. Surveillance colonoscopy is due December 2016.

## 2013-01-13 NOTE — Patient Instructions (Addendum)
Increase your stool softener to twice daily.   Start Linzess samples one tablet by mouth once daily x 2 weeks. You can fill the prescription that Dr. Posey Rea gave you if this helps with constipation.  We have sent the following medications to your pharmacy for you to pick up at your convenience: Anusol cream.   Thank you for choosing me and [facility].  Sheila Hall. Pleas Koch., MD., Clementeen Graham

## 2013-02-05 ENCOUNTER — Telehealth: Payer: Self-pay | Admitting: Internal Medicine

## 2013-02-05 MED ORDER — ZOLPIDEM TARTRATE 5 MG PO TABS: 5.0000 mg | ORAL_TABLET | Freq: Every evening | ORAL | Status: DC | PRN

## 2013-02-05 NOTE — Telephone Encounter (Signed)
Pt req refill for Zolpidem 5 mg. Please advise.

## 2013-02-05 NOTE — Telephone Encounter (Signed)
OK to fill this prescription with additional refills x5 Thank you!  

## 2013-02-05 NOTE — Telephone Encounter (Signed)
rx printed/signed/[facility]ed to [facility]

## 2013-03-25 ENCOUNTER — Ambulatory Visit: Payer: BC Managed Care – PPO | Admitting: Internal Medicine

## 2013-04-30 ENCOUNTER — Telehealth: Payer: Self-pay | Admitting: *Deleted

## 2013-04-30 MED ORDER — OMEPRAZOLE 40 MG PO CPDR: 40.0000 mg | DELAYED_RELEASE_CAPSULE | Freq: Every day | ORAL | Status: DC

## 2013-04-30 NOTE — Telephone Encounter (Signed)
OK to fill both prescriptions with additional refills x1 (6 mo total) Thank you!

## 2013-04-30 NOTE — Telephone Encounter (Signed)
Prilosec sent. Ok for Ambien Rf?

## 2013-04-30 NOTE — Telephone Encounter (Signed)
Pt called and lvm requesting a refill be sent in to [facility] for her Prilosec and Ambien. Call back [phone].

## 2013-05-03 MED ORDER — OMEPRAZOLE 40 MG PO CPDR: 40.0000 mg | DELAYED_RELEASE_CAPSULE | Freq: Every day | ORAL | Status: DC

## 2013-05-03 MED ORDER — ZOLPIDEM TARTRATE 5 MG PO TABS: 5.0000 mg | ORAL_TABLET | Freq: Every evening | ORAL | Status: DC | PRN

## 2013-05-03 NOTE — Telephone Encounter (Signed)
Done

## 2013-07-15 ENCOUNTER — Other Ambulatory Visit: Payer: Self-pay

## 2013-09-05 ENCOUNTER — Other Ambulatory Visit: Payer: Self-pay | Admitting: Internal Medicine

## 2013-12-15 ENCOUNTER — Other Ambulatory Visit: Payer: Self-pay | Admitting: Internal Medicine

## 2014-02-10 ENCOUNTER — Other Ambulatory Visit: Payer: Self-pay | Admitting: Internal Medicine

## 2014-02-18 ENCOUNTER — Ambulatory Visit (INDEPENDENT_AMBULATORY_CARE_PROVIDER_SITE_OTHER): Payer: BC Managed Care – PPO | Admitting: Internal Medicine

## 2014-02-18 ENCOUNTER — Encounter: Payer: Self-pay | Admitting: Internal Medicine

## 2014-02-18 ENCOUNTER — Other Ambulatory Visit (INDEPENDENT_AMBULATORY_CARE_PROVIDER_SITE_OTHER): Payer: BC Managed Care – PPO

## 2014-02-18 VITALS — BP 130/80 | HR 76 | Temp 98.3°F | Resp 16 | Ht 68.0 in | Wt 199.0 lb

## 2014-02-18 DIAGNOSIS — M25559 Pain in unspecified hip: Secondary | ICD-10-CM

## 2014-02-18 DIAGNOSIS — Z0000 Encounter for general adult medical examination without abnormal findings: Secondary | ICD-10-CM

## 2014-02-18 DIAGNOSIS — Z23 Encounter for immunization: Secondary | ICD-10-CM

## 2014-02-18 DIAGNOSIS — M25551 Pain in right hip: Secondary | ICD-10-CM | POA: Insufficient documentation

## 2014-02-18 LAB — HEPATIC FUNCTION PANEL
ALK PHOS: 57 U/L (ref 39–117)
ALT: 25 U/L (ref 0–35)
AST: 25 U/L (ref 0–37)
Albumin: 4.2 g/dL (ref 3.5–5.2)
BILIRUBIN TOTAL: 0.3 mg/dL (ref 0.2–1.2)
Bilirubin, Direct: 0.1 mg/dL (ref 0.0–0.3)
Total Protein: 7.4 g/dL (ref 6.0–8.3)

## 2014-02-18 LAB — BASIC METABOLIC PANEL
BUN: 16 mg/dL (ref 6–23)
CALCIUM: 9.2 mg/dL (ref 8.4–10.5)
CO2: 26 mEq/L (ref 19–32)
Chloride: 104 mEq/L (ref 96–112)
Creatinine, Ser: 0.8 mg/dL (ref 0.4–1.2)
GFR: 76.64 mL/min (ref >60.00)
GLUCOSE: 98 mg/dL (ref 70–99)
POTASSIUM: 4.4 meq/L (ref 3.5–5.1)
SODIUM: 138 meq/L (ref 135–145)

## 2014-02-18 LAB — CBC WITH DIFFERENTIAL/PLATELET
BASOS ABS: 0.1 10*3/uL (ref 0.0–0.1)
Basophils Relative: 0.9 % (ref 0.0–3.0)
EOS PCT: 2.7 % (ref 0.0–5.0)
Eosinophils Absolute: 0.2 10*3/uL (ref 0.0–0.7)
HEMATOCRIT: 41.3 % (ref 36.0–46.0)
HEMOGLOBIN: 14.2 g/dL (ref 12.0–15.0)
LYMPHS PCT: 36.6 % (ref 12.0–46.0)
Lymphs Abs: 2.8 10*3/uL (ref 0.7–4.0)
MCHC: 34.5 g/dL (ref 30.0–36.0)
MCV: 90.7 fl (ref 78.0–100.0)
MONOS PCT: 8.5 % (ref 3.0–12.0)
Monocytes Absolute: 0.6 10*3/uL (ref 0.1–1.0)
NEUTROS ABS: 3.9 10*3/uL (ref 1.4–7.7)
Neutrophils Relative %: 51.3 % (ref 43.0–77.0)
PLATELETS: 386 10*3/uL (ref 150.0–400.0)
RBC: 4.55 Mil/uL (ref 3.87–5.11)
RDW: 13 % (ref 11.5–15.5)
WBC: 7.5 10*3/uL (ref 4.0–10.5)

## 2014-02-18 LAB — URINALYSIS
BILIRUBIN URINE: NEGATIVE
KETONES UR: NEGATIVE
Leukocytes, UA: NEGATIVE
Nitrite: NEGATIVE
PH: 5.5 (ref 5.0–8.0)
Specific Gravity, Urine: >=1.03 — AB (ref 1.000–1.030)
TOTAL PROTEIN, URINE-UPE24: NEGATIVE
URINE GLUCOSE: NEGATIVE
Urobilinogen, UA: 0.2 (ref 0.0–1.0)

## 2014-02-18 LAB — LIPID PANEL
Cholesterol: 251 mg/dL — ABNORMAL HIGH (ref 0–200)
HDL: 54.7 mg/dL (ref >39.00)
LDL CALC: 149 mg/dL — AB (ref 0–99)
NONHDL: 196.3
Total CHOL/HDL Ratio: 5
Triglycerides: 235 mg/dL — ABNORMAL HIGH (ref 0.0–149.0)
VLDL: 47 mg/dL — AB (ref 0.0–40.0)

## 2014-02-18 LAB — TSH: TSH: 1.3 u[IU]/mL (ref 0.35–4.50)

## 2014-02-18 MED ORDER — ESTRADIOL 1 MG PO TABS: ORAL_TABLET | ORAL | Status: DC

## 2014-02-18 MED ORDER — HYDROXYZINE HCL 50 MG PO TABS: ORAL_TABLET | ORAL | Status: DC

## 2014-02-18 MED ORDER — LINACLOTIDE 290 MCG PO CAPS
290.0000 ug | ORAL_CAPSULE | Freq: Every day | ORAL | Status: DC
Start: 2014-02-18 — End: 2015-08-16

## 2014-02-18 MED ORDER — OMEPRAZOLE 40 MG PO CPDR: 40.0000 mg | DELAYED_RELEASE_CAPSULE | Freq: Every day | ORAL | Status: DC

## 2014-02-18 MED ORDER — POTASSIUM CHLORIDE ER 8 MEQ PO TBCR: 8.0000 meq | EXTENDED_RELEASE_TABLET | Freq: Every day | ORAL | Status: DC | PRN

## 2014-02-18 MED ORDER — DOCUSATE SODIUM 100 MG PO CAPS: 100.0000 mg | ORAL_CAPSULE | Freq: Every day | ORAL | Status: DC

## 2014-02-18 MED ORDER — FUROSEMIDE 20 MG PO TABS: 20.0000 mg | ORAL_TABLET | Freq: Every day | ORAL | Status: DC | PRN

## 2014-02-18 MED ORDER — TRAMADOL HCL 50 MG PO TABS: 50.0000 mg | ORAL_TABLET | Freq: Two times a day (BID) | ORAL | Status: DC | PRN

## 2014-02-18 NOTE — Patient Instructions (Addendum)
Wt Readings from Last 3 Encounters:  02/18/14 199 lb (90.266 kg)  01/13/13 191 lb 8 oz (86.864 kg)  01/08/13 194 lb 8 oz (88.225 kg)   Plan Z diet,  Zola's diet  Ice Hip opener exercises Take pressure off the right hip

## 2014-02-18 NOTE — Assessment & Plan Note (Signed)
We discussed age appropriate health related issues, including available/recomended screening tests and vaccinations. We discussed a need for adhering to healthy diet and exercise. Labs/EKG were reviewed/ordered. All questions were answered.  Labs 

## 2014-02-18 NOTE — Progress Notes (Deleted)
Pre visit review using our clinic review tool, if applicable. No additional management support is needed unless otherwise documented below in the visit note. 

## 2014-02-18 NOTE — Assessment & Plan Note (Signed)
Ice Hip opener exercises Take pressure off the right hip

## 2014-02-22 NOTE — Progress Notes (Signed)
Patient ID: Sheila Hall, female   DOB: 03-Feb-1951, 63 y.o.   MRN: [id number]   Subjective:    HPI  The patient is here for a wellness exam. The patient has been doing well overall without major physical or psychological issues going on lately. The patient needs to address  chronic OA that has been well controlled with medicines; to address chronic constipation controlled with medical treatment and diet.   BP Readings from Last 3 Encounters:  02/18/14 130/80  01/13/13 130/80  01/08/13 122/80   Wt Readings from Last 3 Encounters:  02/18/14 199 lb (90.266 kg)  01/13/13 191 lb 8 oz (86.864 kg)  01/08/13 194 lb 8 oz (88.225 kg)       Review of Systems  Constitutional: Negative for chills, activity change, appetite change, fatigue and unexpected weight change.  HENT: Negative for congestion, mouth sores and sinus pressure.   Eyes: Negative for visual disturbance.  Respiratory: Negative for chest tightness and wheezing.   Genitourinary: Negative for frequency, difficulty urinating and vaginal pain.  Musculoskeletal: Negative for gait problem.  Skin: Negative for pallor and rash.  Neurological: Negative for tremors.  Psychiatric/Behavioral: Negative for confusion and sleep disturbance.       Objective:   Physical Exam  Constitutional: She appears well-developed. No distress.  Obese   HENT:  Head: Normocephalic.  Right Ear: External ear normal.  Left Ear: External ear normal.  Nose: Nose normal.  Mouth/Throat: Oropharynx is clear and moist.  Eyes: Conjunctivae are normal. Pupils are equal, round, and reactive to light. Right eye exhibits no discharge. Left eye exhibits no discharge.  Neck: Normal range of motion. Neck supple. No JVD present. No tracheal deviation present. No thyromegaly present.  Cardiovascular: Normal rate, regular rhythm and normal heart sounds.   Pulmonary/Chest: No stridor. No respiratory distress. She has no wheezes. She exhibits no tenderness.   Abdominal: Soft. Bowel sounds are normal. She exhibits no distension and no mass. There is no tenderness. There is no rebound and no guarding.  Musculoskeletal: She exhibits no edema and no tenderness.  Lymphadenopathy:    She has no cervical adenopathy.  Neurological: She displays normal reflexes. No cranial nerve deficit. She exhibits normal muscle tone. Coordination normal.  Skin: No rash noted. No erythema.  Psychiatric: She has a normal mood and affect. Her behavior is normal. Judgment and thought content normal.  R troch major is tender  Lab Results  Component Value Date   WBC 7.5 02/18/2014   HGB 14.2 02/18/2014   HCT 41.3 02/18/2014   PLT 386.0 02/18/2014   GLUCOSE 98 02/18/2014   CHOL 251* 02/18/2014   TRIG 235.0* 02/18/2014   HDL 54.70 02/18/2014   LDLDIRECT 106.2 09/16/2012   LDLCALC 149* 02/18/2014   ALT 25 02/18/2014   AST 25 02/18/2014   NA 138 02/18/2014   K 4.4 02/18/2014   CL 104 02/18/2014   CREATININE 0.8 02/18/2014   BUN 16 02/18/2014   CO2 26 02/18/2014   TSH 1.30 02/18/2014   HGBA1C 5.6 08/05/2007          Assessment & Plan:

## 2014-04-26 ENCOUNTER — Encounter: Payer: Self-pay | Admitting: Internal Medicine

## 2014-07-17 ENCOUNTER — Other Ambulatory Visit: Payer: Self-pay | Admitting: Internal Medicine

## 2014-07-18 ENCOUNTER — Telehealth: Payer: Self-pay | Admitting: Internal Medicine

## 2014-07-18 NOTE — Telephone Encounter (Signed)
Pt requesting right hip injection from Dr Alain Marion ASAP. Pt requests work very soon due to the level of pain. Please advise.

## 2014-07-18 NOTE — Telephone Encounter (Signed)
W/in tomorrow pls Thx

## 2014-07-21 ENCOUNTER — Encounter: Payer: Self-pay | Admitting: Internal Medicine

## 2014-07-21 ENCOUNTER — Ambulatory Visit (INDEPENDENT_AMBULATORY_CARE_PROVIDER_SITE_OTHER): Payer: BC Managed Care – PPO | Admitting: Internal Medicine

## 2014-07-21 VITALS — BP 130/84 | HR 62 | Temp 98.0°F | Wt 199.0 lb

## 2014-07-21 DIAGNOSIS — M25551 Pain in right hip: Secondary | ICD-10-CM

## 2014-07-21 DIAGNOSIS — G4700 Insomnia, unspecified: Secondary | ICD-10-CM

## 2014-07-21 DIAGNOSIS — G43009 Migraine without aura, not intractable, without status migrainosus: Secondary | ICD-10-CM

## 2014-07-21 DIAGNOSIS — Z23 Encounter for immunization: Secondary | ICD-10-CM

## 2014-07-21 MED ORDER — ZOLPIDEM TARTRATE 5 MG PO TABS: 5.0000 mg | ORAL_TABLET | Freq: Every evening | ORAL | Status: DC | PRN

## 2014-07-21 MED ORDER — HYDROCORTISONE 2.5 % RE CREA: TOPICAL_CREAM | Freq: Two times a day (BID) | RECTAL | Status: DC

## 2014-07-21 MED ORDER — METHYLPREDNISOLONE ACETATE 80 MG/ML IJ SUSP
80.0000 mg | Freq: Once | INTRAMUSCULAR | Status: AC
  Administered 2014-07-21: 80 mg via INTRAMUSCULAR

## 2014-07-21 NOTE — Assessment & Plan Note (Signed)
IT band stretching Will inject - see procedure

## 2014-07-21 NOTE — Assessment & Plan Note (Signed)
Chronic   Potential benefits of a long term benzodiazepines  use as well as potential risks  and complications were explained to the patient and were aknowledged. Ambien low dose prn

## 2014-07-21 NOTE — Assessment & Plan Note (Signed)
Recurrent Continue with current prn prescription therapy as reflected on the Med list.

## 2014-07-21 NOTE — Patient Instructions (Signed)
Postprocedure instructions :    A Band-Aid should be left on for 12 hours. Injection therapy is not a cure itself. It is used in conjunction with other modalities. You can use nonsteroidal anti-inflammatories like ibuprofen , hot and cold compresses. Rest is recommended in the next 24 hours. You need to report immediately  if fever, chills or any signs of infection develop. 

## 2014-07-21 NOTE — Progress Notes (Signed)
Subjective:    HPI  C/o R hip pain - worse C/o insomnia The patient needs to address  chronic OA that has been well controlled with medicines; to address chronic constipation controlled with medical treatment and diet.   BP Readings from Last 3 Encounters:  07/21/14 130/84  02/18/14 130/80  01/13/13 130/80   Wt Readings from Last 3 Encounters:  07/21/14 199 lb (90.266 kg)  02/18/14 199 lb (90.266 kg)  01/13/13 191 lb 8 oz (86.864 kg)       Review of Systems  Constitutional: Negative for chills, activity change, appetite change, fatigue and unexpected weight change.  HENT: Negative for congestion, mouth sores and sinus pressure.   Eyes: Negative for visual disturbance.  Respiratory: Negative for chest tightness and wheezing.   Genitourinary: Negative for frequency, difficulty urinating and vaginal pain.  Musculoskeletal: Negative for gait problem.  Skin: Negative for pallor and rash.  Neurological: Negative for tremors.  Psychiatric/Behavioral: Negative for confusion and sleep disturbance.       Objective:   Physical Exam  Constitutional: She appears well-developed. No distress.  HENT:  Head: Normocephalic.  Right Ear: External ear normal.  Left Ear: External ear normal.  Nose: Nose normal.  Mouth/Throat: Oropharynx is clear and moist.  Eyes: Conjunctivae are normal. Pupils are equal, round, and reactive to light. Right eye exhibits no discharge. Left eye exhibits no discharge.  Neck: Normal range of motion. Neck supple. No JVD present. No tracheal deviation present. No thyromegaly present.  Cardiovascular: Normal rate, regular rhythm and normal heart sounds.   Pulmonary/Chest: No stridor. No respiratory distress. She has no wheezes.  Abdominal: Soft. Bowel sounds are normal. She exhibits no distension and no mass. There is no tenderness. There is no rebound and no guarding.  Musculoskeletal: She exhibits no edema or tenderness.  Lymphadenopathy:    She has no  cervical adenopathy.  Neurological: She displays normal reflexes. No cranial nerve deficit. She exhibits normal muscle tone. Coordination normal.  Skin: No rash noted. No erythema.  Psychiatric: She has a normal mood and affect. Her behavior is normal. Judgment and thought content normal.  R troch major is tender  Lab Results  Component Value Date   WBC 7.5 02/18/2014   HGB 14.2 02/18/2014   HCT 41.3 02/18/2014   PLT 386.0 02/18/2014   GLUCOSE 98 02/18/2014   CHOL 251* 02/18/2014   TRIG 235.0* 02/18/2014   HDL 54.70 02/18/2014   LDLDIRECT 106.2 09/16/2012   LDLCALC 149* 02/18/2014   ALT 25 02/18/2014   AST 25 02/18/2014   NA 138 02/18/2014   K 4.4 02/18/2014   CL 104 02/18/2014   CREATININE 0.8 02/18/2014   BUN 16 02/18/2014   CO2 26 02/18/2014   TSH 1.30 02/18/2014   HGBA1C 5.6 08/05/2007     Procedure Note :     Procedure : Joint Injection,  R  hip   Indication:  Trochanteric bursitis with refractory  chronic pain.   Risks including unsuccessful procedure , bleeding, infection, bruising, skin atrophy, "steroid flare-up" and others were explained to the patient in detail as well as the benefits. Informed consent was obtained and signed.   Tthe patient was placed in a comfortable lateral decubitus position. The point of maximal tenderness was identified. Skin was prepped with Betadine and alcohol. Then, a 5 cc syringe with a 2 inch long 24-gauge needle was used for a bursa injection.. The needle was advanced  Into the bursa. I injected the bursa with 4  mL of 2% lidocaine and 40 mg of Depo-Medrol .  Band-Aid was applied.   Tolerated well. Complications: None. Good pain relief following the procedure.   Postprocedure instructions :    A Band-Aid should be left on for 12 hours. Injection therapy is not a cure itself. It is used in conjunction with other modalities. You can use nonsteroidal anti-inflammatories like ibuprofen , hot and cold compresses. Rest is recommended in  the next 24 hours. You need to report immediately  if fever, chills or any signs of infection develop.        Assessment & Plan:

## 2014-07-21 NOTE — Progress Notes (Signed)
Pre visit review using our clinic review tool, if applicable. No additional management support is needed unless otherwise documented below in the visit note. 

## 2014-11-15 ENCOUNTER — Other Ambulatory Visit (INDEPENDENT_AMBULATORY_CARE_PROVIDER_SITE_OTHER): Payer: Medicare Other

## 2014-11-15 ENCOUNTER — Ambulatory Visit (INDEPENDENT_AMBULATORY_CARE_PROVIDER_SITE_OTHER): Payer: Medicare Other | Admitting: Gastroenterology

## 2014-11-15 ENCOUNTER — Encounter: Payer: Self-pay | Admitting: Gastroenterology

## 2014-11-15 VITALS — BP 136/70 | HR 83 | Ht 68.0 in | Wt 200.0 lb

## 2014-11-15 DIAGNOSIS — M791 Myalgia: Secondary | ICD-10-CM

## 2014-11-15 DIAGNOSIS — M7918 Myalgia, other site: Secondary | ICD-10-CM

## 2014-11-15 DIAGNOSIS — M25552 Pain in left hip: Secondary | ICD-10-CM | POA: Diagnosis not present

## 2014-11-15 LAB — COMPREHENSIVE METABOLIC PANEL
ALK PHOS: 66 U/L (ref 39–117)
ALT: 34 U/L (ref 0–35)
AST: 27 U/L (ref 0–37)
Albumin: 4.5 g/dL (ref 3.5–5.2)
BUN: 13 mg/dL (ref 6–23)
CALCIUM: 9.7 mg/dL (ref 8.4–10.5)
CHLORIDE: 102 meq/L (ref 96–112)
CO2: 31 mEq/L (ref 19–32)
CREATININE: 0.88 mg/dL (ref 0.40–1.20)
GFR: 68.5 mL/min (ref >60.00)
Glucose, Bld: 98 mg/dL (ref 70–99)
Potassium: 4.2 mEq/L (ref 3.5–5.1)
Sodium: 138 mEq/L (ref 135–145)
Total Bilirubin: 0.4 mg/dL (ref 0.2–1.2)
Total Protein: 7.6 g/dL (ref 6.0–8.3)

## 2014-11-15 LAB — CBC WITH DIFFERENTIAL/PLATELET
Basophils Absolute: 0.1 10*3/uL (ref 0.0–0.1)
Basophils Relative: 0.7 % (ref 0.0–3.0)
EOS ABS: 0.1 10*3/uL (ref 0.0–0.7)
Eosinophils Relative: 1.2 % (ref 0.0–5.0)
HCT: 41.2 % (ref 36.0–46.0)
Hemoglobin: 13.8 g/dL (ref 12.0–15.0)
LYMPHS ABS: 2.9 10*3/uL (ref 0.7–4.0)
Lymphocytes Relative: 33.4 % (ref 12.0–46.0)
MCHC: 33.6 g/dL (ref 30.0–36.0)
MCV: 90.6 fl (ref 78.0–100.0)
Monocytes Absolute: 0.8 10*3/uL (ref 0.1–1.0)
Monocytes Relative: 9.1 % (ref 3.0–12.0)
NEUTROS PCT: 55.6 % (ref 43.0–77.0)
Neutro Abs: 4.9 10*3/uL (ref 1.4–7.7)
PLATELETS: 415 10*3/uL — AB (ref 150.0–400.0)
RBC: 4.55 Mil/uL (ref 3.87–5.11)
RDW: 12.7 % (ref 11.5–15.5)
WBC: 8.8 10*3/uL (ref 4.0–10.5)

## 2014-11-15 NOTE — Progress Notes (Signed)
History of Present Illness: This is a 64 year old female who relates a 3 week history of constant left buttock pain and left hip pain. Her left hip bothers her with bending, movement, flexion. She is taking tramadol at home for management of the pain which has controlled the pain. She notes no fevers or chills. She has a history of diverticulitis.  Allergies  Allergen Reactions  . Codeine Sulfate     REACTION: nausea   Outpatient Prescriptions Prior to Visit  Medication Sig Dispense Refill  . albuterol (PROVENTIL HFA;VENTOLIN HFA) 108 (90 BASE) MCG/ACT inhaler Inhale 2 puffs into the lungs every 4 (four) hours as needed for wheezing.    . docusate sodium (COLACE) 100 MG capsule Take 1 capsule (100 mg total) by mouth at bedtime. 100 capsule 3  . estradiol (ESTRACE) 1 MG tablet Take 0.5 tablets (0.5 mg total) by mouth daily. 45 tablet 2  . furosemide (LASIX) 20 MG tablet Take 1-2 tablets (20-40 mg total) by mouth daily as needed for edema. 180 tablet 3  . hydrocortisone (ANUSOL-HC) 2.5 % rectal cream Place rectally 2 (two) times daily. 30 g 1  . hydrOXYzine (ATARAX/VISTARIL) 50 MG tablet Take 50 mg by mouth at bedtime as needed for itching (or sleep).    . Linaclotide (LINZESS) 290 MCG CAPS capsule Take 1 capsule (290 mcg total) by mouth daily. 90 capsule 3  . omeprazole (PRILOSEC) 40 MG capsule Take 1 capsule (40 mg total) by mouth daily. 90 capsule 3  . potassium chloride (KLOR-CON) 8 MEQ tablet Take 1 tablet (8 mEq total) by mouth daily as needed. Take with Lasix 90 tablet 3  . traMADol (ULTRAM) 50 MG tablet Take 1 tablet (50 mg total) by mouth 2 (two) times daily as needed. 120 tablet 3  . zolpidem (AMBIEN) 5 MG tablet Take 1 tablet (5 mg total) by mouth at bedtime as needed for sleep. 45 tablet 2  . hyoscyamine (LEVSIN, ANASPAZ) 0.125 MG tablet Take 1 tablet (0.125 mg total) by mouth every 4 (four) hours as needed for cramping. 30 tablet 0   Facility-Administered Medications Prior to  Visit  Medication Dose Route Frequency Provider Last Rate Last Dose  . DOBUTamine (DOBUTREX) 1,000 mcg/mL in sodium chloride 0.9 % 150 mL infusion  40 mcg/kg Intravenous Continuous Lew Dawes V, MD   40 mcg/kg/min at 06/09/12 1530   Past Medical History  Diagnosis Date  . ASTHMA 08/12/2007    seasonal  . DISORDER, CERVICAL DISC W/MYELOPATHY 05/19/2007  . Edema 03/10/2009  . GERD 04/13/2007  . Headache(784.0) 02/15/2010  . HYPERLIPIDEMIA 04/13/2007  . OSTEOPENIA 04/13/2007  . OSTEOPOROSIS 05/23/2007  . SYNCOPE 02/15/2010  . History of colon polyps 08/14/2010  . Diverticulosis 08/14/2010   Past Surgical History  Procedure Laterality Date  . Abdominal hysterectomy    . Appendectomy    . Cholecystectomy    . Tubal ligation    . Cervical laminectomy    . Lumbar disc surgery    . Tonsillectomy    . Other surgical history      left lower arm skin cryo - benign   History   Social History  . Marital Status: Married    Spouse Name: N/A  . Number of Children: 2  . Years of Education: N/A   Occupational History  . PLANNING    Social History Main Topics  . Smoking status: Never Smoker   . Smokeless tobacco: Never Used  . Alcohol Use: No  . Drug Use:  No  . Sexual Activity: Yes   Other Topics Concern  . None   Social History Narrative   Family History  Problem Relation Age of Onset  . Colon cancer Father 52  . Cancer Father 7    bladder, prostate  . Diabetes Brother   . Cancer Mother 37    pancreatic ca  . Heart disease Sister 17    CAD  . Breast cancer Maternal Aunt   . Colon polyps      sistersx2, brothersx2  . Kidney disease Brother       Physical Exam: General: Well developed , well nourished, no acute distress Head: Normocephalic and atraumatic Eyes:  sclerae anicteric, EOMI Ears: Normal auditory acuity Mouth: No deformity or lesions Lungs: Clear throughout to auscultation Heart: Regular rate and rhythm; no murmurs, rubs or bruits Abdomen: Soft, non tender  and non distended. No masses, hepatosplenomegaly or hernias noted. Normal Bowel sounds Rectal: tender full and indurated area in left buttock, external hemorrhoids, no internal lesions, heme neg stool. Musculoskeletal: Symmetrical with no gross deformities  Pulses:  Normal pulses noted Extremities: Pain in left hip with flexion. No clubbing, cyanosis, edema or deformities noted Neurological: Alert oriented x 4, grossly nonfocal Psychological:  Alert and cooperative. Normal mood and affect  Assessment and Recommendations:  1. Left buttock pain, left hip pain, perirectal pain, left buttock induration and tenderness. History of diverticulitis. R/O perirectal abscess. Abd/pelvic CT within next 24 hours. Rest until then. Continue Tramadol for pain control. CBC, CMET today. Appetite perirectal abscesses confirmed plan for surgical referral.

## 2014-11-15 NOTE — Patient Instructions (Addendum)
Your physician has requested that you go to the basement for the following lab work before leaving today:CBC, [place].  You have been scheduled for a CT scan of the abdomen and pelvis at [facility] ([address]---this is in the same building as Press [facility]).   You are scheduled on 11/16/14 at 8:30am. You should arrive 15 minutes prior to your appointment time for registration. Please follow the written instructions below on the day of your exam:  WARNING: IF YOU ARE ALLERGIC TO IODINE/X-RAY DYE, PLEASE NOTIFY RADIOLOGY IMMEDIATELY AT [phone]! YOU WILL BE GIVEN A 13 HOUR PREMEDICATION PREP.  1) Do not eat or drink anything after 4:30am (4 hours prior to your test) 2) You have been given 2 bottles of oral contrast to drink. The solution may taste better if refrigerated, but do NOT add ice or any other liquid to this solution. Shake well before drinking.    Drink 1 bottle of contrast @ 6:30am (2 hours prior to your exam)  Drink 1 bottle of contrast @ 7:30am (1 hour prior to your exam)  You may take any medications as prescribed with a small amount of water except for the following: Metformin, Glucophage, Glucovance, Avandamet, Riomet, Fortamet, Actoplus Met, Janumet, Glumetza or Metaglip. The above medications must be held the day of the exam AND 48 hours after the exam.  The purpose of you drinking the oral contrast is to aid in the visualization of your intestinal tract. The contrast solution may cause some diarrhea. Before your exam is started, you will be given a small amount of fluid to drink. Depending on your individual set of symptoms, you may also receive an intravenous injection of x-ray contrast/dye. Plan on being at [facility] for 30 minutes or long, depending on the type of exam you are having performed.  This test typically takes 30-45 minutes to complete.  If you have any questions regarding your exam or if you need to reschedule, you may call the CT  department at [phone] between the hours of 8:00 am and 5:00 pm, Monday-Friday.  ________________________________________________________________________  Thank you for choosing me and [facility].  Pricilla Riffle. Dagoberto Ligas., MD., Marval Regal

## 2014-11-16 ENCOUNTER — Telehealth: Payer: Self-pay

## 2014-11-16 ENCOUNTER — Ambulatory Visit (INDEPENDENT_AMBULATORY_CARE_PROVIDER_SITE_OTHER)
Admission: RE | Admit: 2014-11-16 | Discharge: 2014-11-16 | Disposition: A | Discharge status: Home / Self Care | Payer: Medicare Other | Source: Ambulatory Visit | Attending: Gastroenterology | Admitting: Gastroenterology

## 2014-11-16 DIAGNOSIS — K5730 Diverticulosis of large intestine without perforation or abscess without bleeding: Secondary | ICD-10-CM | POA: Diagnosis not present

## 2014-11-16 DIAGNOSIS — M25552 Pain in left hip: Secondary | ICD-10-CM

## 2014-11-16 DIAGNOSIS — M7918 Myalgia, other site: Secondary | ICD-10-CM

## 2014-11-16 MED ORDER — IOHEXOL 300 MG/ML  SOLN
100.0000 mL | Freq: Once | INTRAMUSCULAR | Status: AC | PRN
  Administered 2014-11-16: 100 mL via INTRAVENOUS

## 2014-11-16 MED ORDER — BARIUM SULFATE 2 % PO SUSP: 450.0000 mL | Freq: Once | ORAL | Status: DC

## 2014-11-16 NOTE — Telephone Encounter (Signed)
Patient is scheduled for MRI hip and pelvis for Friday 11/18/14 7:00 pm at [facility].  She needs to arrive at 6:45 She is scheduled to see Dr. Marcello Moores Friday 11/18/14 arrive at 2:40 for a 3:10 appt She is notified of the results of CT, recommendations, and new appt details

## 2014-11-18 ENCOUNTER — Ambulatory Visit (HOSPITAL_COMMUNITY)
Admission: RE | Admit: 2014-11-18 | Discharge: 2014-11-18 | Disposition: A | Discharge status: Home / Self Care | Payer: Medicare Other | Source: Ambulatory Visit | Attending: Gastroenterology | Admitting: Gastroenterology

## 2014-11-18 DIAGNOSIS — M7918 Myalgia, other site: Secondary | ICD-10-CM

## 2014-11-18 DIAGNOSIS — M25552 Pain in left hip: Secondary | ICD-10-CM | POA: Diagnosis not present

## 2014-11-18 DIAGNOSIS — K5730 Diverticulosis of large intestine without perforation or abscess without bleeding: Secondary | ICD-10-CM | POA: Diagnosis not present

## 2014-11-18 DIAGNOSIS — M791 Myalgia: Secondary | ICD-10-CM | POA: Diagnosis not present

## 2014-11-18 MED ORDER — GADOBENATE DIMEGLUMINE 529 MG/ML IV SOLN
20.0000 mL | Freq: Once | INTRAVENOUS | Status: AC | PRN
  Administered 2014-11-18: 18 mL via INTRAVENOUS

## 2014-11-22 ENCOUNTER — Telehealth: Payer: Self-pay | Admitting: Internal Medicine

## 2014-11-22 DIAGNOSIS — M25552 Pain in left hip: Secondary | ICD-10-CM

## 2014-11-22 MED ORDER — HYDROXYZINE HCL 50 MG PO TABS: 50.0000 mg | ORAL_TABLET | Freq: Every evening | ORAL | Status: DC | PRN

## 2014-11-22 MED ORDER — OMEPRAZOLE 40 MG PO CPDR: 40.0000 mg | DELAYED_RELEASE_CAPSULE | Freq: Every day | ORAL | Status: DC

## 2014-11-22 NOTE — Telephone Encounter (Signed)
Pt informed

## 2014-11-22 NOTE — Telephone Encounter (Signed)
Pt just had MRI done.  Wants to know the results and next step  Best number [phone]

## 2014-11-22 NOTE — Telephone Encounter (Signed)
MRI: Mild tendinosis of the left hamstring origin with mild edema in the ischial tuberosity.  Will ref to Sports Med Thx

## 2014-11-26 ENCOUNTER — Encounter: Payer: Self-pay | Admitting: Family Medicine

## 2014-11-26 ENCOUNTER — Other Ambulatory Visit (INDEPENDENT_AMBULATORY_CARE_PROVIDER_SITE_OTHER): Payer: Medicare Other

## 2014-11-26 ENCOUNTER — Ambulatory Visit (INDEPENDENT_AMBULATORY_CARE_PROVIDER_SITE_OTHER): Payer: Medicare Other | Admitting: Family Medicine

## 2014-11-26 VITALS — BP 118/80 | HR 88 | Ht 68.0 in | Wt 205.0 lb

## 2014-11-26 DIAGNOSIS — M7072 Other bursitis of hip, left hip: Secondary | ICD-10-CM | POA: Diagnosis not present

## 2014-11-26 DIAGNOSIS — M25552 Pain in left hip: Secondary | ICD-10-CM

## 2014-11-26 NOTE — Patient Instructions (Signed)
Good to see you Ice 20 minutes 2 times daily. Usually after activity and before bed. Try the pennsaid twice daily Exercises 3 times a week.  Wear compression sleeve daily until pan free.  See me again in 3 weeks.

## 2014-11-26 NOTE — Progress Notes (Signed)
Pre visit review using our clinic review tool, if applicable. No additional management support is needed unless otherwise documented below in the visit note. 

## 2014-11-26 NOTE — Assessment & Plan Note (Signed)
She was given an injection today. Patient will also wear a thigh compression sleeve. We discussed icing regimen and home exercises. We discussed topical anti-inflammatories. Patient will take it easy over the course of next 48 hours and then increase activity as tolerated. Patient then will come back in 3 weeks for further evaluation and treatment.

## 2014-11-26 NOTE — Progress Notes (Signed)
[facility] [address] [place], [place] [address] Phone: [phone] Subjective:     CC: Left hip pain and buttock pain.  Sheila Hall is a 64 y.o. female coming in with complaint of patient isn't quite some pain with left buttock and hip pain. Patient's pain has been going on for months and seem to be getting worse. Patient actually had advanced imaging MRI of the left hip that showed mild tendinosis of the left hamstring with ischial bursitis.'s was reviewed by me today. Patient states that she continues to have the discomfort. States that it is very painful with sitting onto her left side. An states that if she is walking up or down her leg and also be uncomfortable. Sometimes some radiating pain down the leg. Denies any back pain.      Past medical history, social, surgical and family history all reviewed in electronic medical record.   Review of Systems: No headache, visual changes, nausea, vomiting, diarrhea, constipation, dizziness, abdominal pain, skin rash, fevers, chills, night sweats, weight loss, swollen lymph nodes, body aches, joint swelling, muscle aches, chest pain, shortness of breath, mood changes.   Objective Blood pressure 118/80, pulse 88, height 5\' 8"  (1.727 m), weight 205 lb (92.987 kg), SpO2 96 %.  General: No apparent distress alert and oriented x3 mood and affect normal, dressed appropriately.  HEENT: Pupils equal, extraocular movements intact  Respiratory: Patient's speak in full sentences and does not appear short of breath  Cardiovascular: No lower extremity edema, non tender, no erythema  Skin: Warm dry intact with no signs of infection or rash on extremities or on axial skeleton.  Abdomen: Soft nontender  Neuro: Cranial nerves II through XII are intact, neurovascularly intact in all extremities with 2+ DTRs and 2+ pulses.  Lymph: No lymphadenopathy of posterior or anterior cervical chain or axillae  bilaterally.  Gait normal with good balance and coordination.  MSK:  Non tender with full range of motion and good stability and symmetric strength and tone of shoulders, elbows, wrist,   knee and ankles bilaterally.   Hip: Left ROM IR: 25 Deg, ER: 45 Deg, Flexion: 120 Deg, Extension: 100 Deg, Abduction: 45 Deg, Adduction: 45 Deg Strength IR: 5/5, ER: 5/5, Flexion: 5/5, Extension: 5/5, Abduction: 4/5, Adduction: 5/5 Pelvic alignment unremarkable to inspection and palpation. Standing hip rotation and gait without trendelenburg sign / unsteadiness. Greater trochanter without tenderness to palpation. No tenderness over piriformis and greater trochanter. Mild positive Faber tender over the ischial bursa tight hamstrings noted No SI joint tenderness and normal minimal SI movement. contralateral hip unremarkable  MSK US performed of: Left hip This study was ordered, performed, and interpreted by Charlann Boxer D.O.  Hip: Trochanteric bursa without swelling or effusion. Acetabular labrum visualized and without tears, displacement, or effusion in joint. Femoral neck appears unremarkable without increased power doppler signal along Cortex. Patient's insertion of her hamstring to the ischial bursa does have significant hypoechoic changes. Bursa sac noted. IMPRESSION:  Ischial bursitis   Procedure: Real-time Ultrasound Guided Injection of left ischial bursa Device: GE Logiq E  Ultrasound guided injection is preferred based studies that show increased duration, increased effect, greater accuracy, decreased procedural pain, increased response rate, and decreased cost with ultrasound guided versus blind injection.  Verbal informed consent obtained.  Time-out conducted.  Noted no overlying erythema, induration, or other signs of local infection.  Skin prepped in a sterile fashion.  Local anesthesia: Topical Ethyl chloride.  With sterile  technique and under real time ultrasound guidance: With a  22-gauge 3 inch needle patient was injected with 2 mL of 0.5% Marcaine and 1 mL of Kenalog 41 g/dL.   Completed without difficulty  Pain immediately resolved suggesting accurate placement of the medication.  Advised to call if fevers/chills, erythema, induration, drainage, or persistent bleeding.  Images permanently stored and available for review in the ultrasound unit.  Impression: Technically successful ultrasound guided injection.  Procedure note 20233; 15 minutes spent for Therapeutic exercises as stated in above notes.  This included exercises focusing on stretching, strengthening, with significant focus on eccentric aspects.   Proper technique shown and discussed handout in great detail with ATC.  All questions were discussed and answered.     Impression and Recommendations:     This case required medical decision making of moderate complexity.

## 2014-12-20 ENCOUNTER — Ambulatory Visit (INDEPENDENT_AMBULATORY_CARE_PROVIDER_SITE_OTHER): Payer: Medicare Other | Admitting: Family Medicine

## 2014-12-20 ENCOUNTER — Encounter: Payer: Self-pay | Admitting: Family Medicine

## 2014-12-20 VITALS — BP 128/80 | HR 71 | Ht 68.0 in | Wt 201.0 lb

## 2014-12-20 DIAGNOSIS — M7072 Other bursitis of hip, left hip: Secondary | ICD-10-CM | POA: Diagnosis not present

## 2014-12-20 MED ORDER — GABAPENTIN 100 MG PO CAPS: 200.0000 mg | ORAL_CAPSULE | Freq: Every day | ORAL | Status: DC

## 2014-12-20 NOTE — Progress Notes (Signed)
Pre visit review using our clinic review tool, if applicable. No additional management support is needed unless otherwise documented below in the visit note. 

## 2014-12-20 NOTE — Patient Instructions (Signed)
Good to see you Start gabapentin 100mg  at night for 1 week then 200mg  nightly thereafter.  Continue everything else you are doing.  See me again next Friday and we will do PRP.

## 2014-12-20 NOTE — Assessment & Plan Note (Signed)
Patient responded to the injection for proximal wing one week and then the pain started to come back. Patient has had the past medical history significant for back fusion previously. Patient states that this feels different. We discussed different possibilities and patient has failed all other conservative therapy. Patient is going to have a PRP injection. Patient will be scheduled for that over the course of next 2 weeks. We discussed icing regimen. We discussed the possibility of further imaging the patient wanted to elected try this first. In the meantime patient will continue with conservative therapy including home exercises, compression sleeve, icing as well as manual massage.  Spent  25 minutes with patient face-to-face and had greater than 50% of counseling including as described above in assessment and plan.

## 2014-12-20 NOTE — Progress Notes (Signed)
  [facility] [address], [place] [address] Phone: [phone] Subjective:     CC: Left hip pain and buttock pain.  ULA:[id number] Sheila Hall is a 64 y.o. female coming in with complaint of patient isn't quite some pain with left buttock and hip pain. Patient was found to have more of an ischial bursitis. Patient was given an injection and conservative therapy including home exercises, icing protocol, as well as we discussed compression. Patient states she was pain free for approximately 1 week and in the pain started coming back. Patient states it is very similar to her previous presentation before the injection. Patient continues the home exercises, icing protocol, and patient attempt to get compression sleeves but is not wearing them on a regular basis. Patient rates the severity of pain still is 6 out of 10 and it does stop her from certain activities.     Past medical history, social, surgical and family history all reviewed in electronic medical record.   Review of Systems: No headache, visual changes, nausea, vomiting, diarrhea, constipation, dizziness, abdominal pain, skin rash, fevers, chills, night sweats, weight loss, swollen lymph nodes, body aches, joint swelling, muscle aches, chest pain, shortness of breath, mood changes.   Objective Blood pressure 128/80, pulse 71, height 5\' 8"  (1.727 m), weight 201 lb (91.173 kg), SpO2 96 %.  General: No apparent distress alert and oriented x3 mood and affect normal, dressed appropriately.  HEENT: Pupils equal, extraocular movements intact  Respiratory: Patient's speak in full sentences and does not appear short of breath  Cardiovascular: No lower extremity edema, non tender, no erythema  Skin: Warm dry intact with no signs of infection or rash on extremities or on axial skeleton.  Abdomen: Soft nontender  Neuro: Cranial nerves II through XII are intact, neurovascularly intact in all extremities  with 2+ DTRs and 2+ pulses.  Lymph: No lymphadenopathy of posterior or anterior cervical chain or axillae bilaterally.  Gait normal with good balance and coordination.  MSK:  Non tender with full range of motion and good stability and symmetric strength and tone of shoulders, elbows, wrist,   knee and ankles bilaterally.   Hip: Left ROM IR: 25 Deg, ER: 45 Deg, Flexion: 120 Deg, Extension: 100 Deg, Abduction: 45 Deg, Adduction: 45 Deg Strength IR: 5/5, ER: 5/5, Flexion: 5/5, Extension: 5/5, Abduction: 4/5, Adduction: 5/5 Pelvic alignment unremarkable to inspection and palpation. Standing hip rotation and gait without trendelenburg sign / unsteadiness. Greater trochanter without tenderness to palpation. No tenderness over piriformis and greater trochanter. Mild positive Faber tender over the ischial bursa tight hamstrings noted still. Mild increase in tenderness over the sacroiliac joints bilaterally. contralateral hip unremarkable No Change from previous exam.      Impression and Recommendations:     This case required medical decision making of moderate complexity.

## 2014-12-30 ENCOUNTER — Ambulatory Visit (INDEPENDENT_AMBULATORY_CARE_PROVIDER_SITE_OTHER): Payer: Self-pay | Admitting: Family Medicine

## 2014-12-30 ENCOUNTER — Encounter: Payer: Self-pay | Admitting: Family Medicine

## 2014-12-30 VITALS — BP 140/82 | HR 69 | Ht 68.0 in | Wt 201.0 lb

## 2014-12-30 DIAGNOSIS — M7072 Other bursitis of hip, left hip: Secondary | ICD-10-CM

## 2014-12-30 NOTE — Progress Notes (Signed)
Pre visit review using our clinic review tool, if applicable. No additional management support is needed unless otherwise documented below in the visit note. 

## 2014-12-30 NOTE — Patient Instructions (Addendum)
Good to see you No ice  A little heat is ok Try to stay on crutches next 48 hours.  Then toe touch as much as possible another 2 days Starting 2nd week ok to bike with no resistance Continue the tramadol See me in 18-21 (3weeks)

## 2014-12-30 NOTE — Assessment & Plan Note (Signed)
Patient was given a PRP injection today. We discussed not icing for the next 48 hours and no weightbearing for the next 48 hours and then toe-touch for next 48 hours. Patient then second week we'll start increasing her activity. Patient will be seen and third week before she increases all her activity.

## 2014-12-30 NOTE — Progress Notes (Signed)
[facility] [address], [place] [address] Phone: [phone] Subjective:     CC: Left hip pain and buttock pain.  [id number] Sheila Hall is a 64 y.o. female coming in with complaint of patient left-sided back pain. Patient was seen previously and did have an ischial bursitis. This had been a long chronic problem. Patient did have an injection that did give her some resolution of pain for approximately 2 weeks but unfortunately the pain started coming back. Patient is here for PRP injection today.     Past medical history, social, surgical and family history all reviewed in electronic medical record.   Review of Systems: No headache, visual changes, nausea, vomiting, diarrhea, constipation, dizziness, abdominal pain, skin rash, fevers, chills, night sweats, weight loss, swollen lymph nodes, body aches, joint swelling, muscle aches, chest pain, shortness of breath, mood changes.   Objective Blood pressure 140/82, pulse 69, height 5\' 8"  (1.727 m), weight 201 lb (91.173 kg), SpO2 99 %.  General: No apparent distress alert and oriented x3 mood and affect normal, dressed appropriately.  HEENT: Pupils equal, extraocular movements intact  Respiratory: Patient's speak in full sentences and does not appear short of breath  Cardiovascular: No lower extremity edema, non tender, no erythema  Skin: Warm dry intact with no signs of infection or rash on extremities or on axial skeleton.  Abdomen: Soft nontender  Neuro: Cranial nerves II through XII are intact, neurovascularly intact in all extremities with 2+ DTRs and 2+ pulses.  Lymph: No lymphadenopathy of posterior or anterior cervical chain or axillae bilaterally.  Gait normal with good balance and coordination.  MSK:  Non tender with full range of motion and good stability and symmetric strength and tone of shoulders, elbows, wrist,   knee and ankles bilaterally.   Hip: Left ROM IR: 25 Deg,  ER: 45 Deg, Flexion: 120 Deg, Extension: 100 Deg, Abduction: 45 Deg, Adduction: 45 Deg Strength IR: 5/5, ER: 5/5, Flexion: 5/5, Extension: 5/5, Abduction: 4/5, Adduction: 5/5 Pelvic alignment unremarkable to inspection and palpation. Standing hip rotation and gait without trendelenburg sign / unsteadiness. Greater trochanter without tenderness to palpation. No tenderness over piriformis and greater trochanter. Mild positive Faber tender over the ischial bursa tight hamstrings noted No SI joint tenderness and normal minimal SI movement. contralateral hip unremarkable Same as previous exam  MSK US performed of: Left hip This study was ordered, performed, and interpreted by Charlann Boxer D.O.  Hip: Trochanteric bursa without swelling or effusion. Acetabular labrum visualized and without tears, displacement, or effusion in joint. Femoral neck appears unremarkable without increased power doppler signal along Cortex. Patient's insertion of her hamstring to the ischial bursa does have significant hypoechoic changes. Bursa sac noted. New chronic scarring noted IMPRESSION:  Ischial bursitis with chronic scarring   Procedure: Real-time Ultrasound Guided Injection of left ischial bursa Device: GE Logiq E  Ultrasound guided injection is preferred based studies that show increased duration, increased effect, greater accuracy, decreased procedural pain, increased response rate, and decreased cost with ultrasound guided versus blind injection.  Verbal informed consent obtained.  Time-out conducted.  Noted no overlying erythema, induration, or other signs of local infection.  Skin prepped in a sterile fashion.  Local anesthesia: Topical Ethyl chloride.  With sterile technique and under real time ultrasound guidance: With a 22-gauge 3 inch needle patient was injected with 4 mL of PRP.   Completed without difficulty  Pain immediately resolved suggesting accurate placement of the medication.  Advised to  call if fevers/chills, erythema, induration, drainage, or persistent bleeding.  Images permanently stored and available for review in the ultrasound unit.  Impression: Technically successful ultrasound guided injection.      Impression and Recommendations:     This case required medical decision making of moderate complexity.

## 2015-01-02 DIAGNOSIS — H16223 Keratoconjunctivitis sicca, not specified as Sjogren's, bilateral: Secondary | ICD-10-CM | POA: Diagnosis not present

## 2015-01-09 ENCOUNTER — Telehealth: Payer: Self-pay | Admitting: Internal Medicine

## 2015-01-09 MED ORDER — AMOXICILLIN-POT CLAVULANATE 875-125 MG PO TABS: 1.0000 | ORAL_TABLET | Freq: Two times a day (BID) | ORAL | Status: DC

## 2015-01-09 NOTE — Telephone Encounter (Signed)
Patient states that she is having a diverticulitis attack and needs amoxicillin to be caslled in to [facility] on [address]. She stated that she was told to just call in and we would take care of scripts. Advised that due to amount of time between last visit, we probably need to get her in.

## 2015-01-09 NOTE — Telephone Encounter (Signed)
Please advise, thanks.

## 2015-01-09 NOTE — Telephone Encounter (Signed)
I'll email Augmentin Sch OV as well Thx

## 2015-01-10 MED ORDER — ONDANSETRON HCL 4 MG PO TABS: 4.0000 mg | ORAL_TABLET | Freq: Three times a day (TID) | ORAL | Status: DC | PRN

## 2015-01-10 NOTE — Telephone Encounter (Signed)
Patient will be picking up augmentin, and she is making appointment to come in for office visit, but she is now vomiting this morning, can you also call in something for nausea, please advise and i will call her back, thanks

## 2015-01-10 NOTE — Telephone Encounter (Signed)
Ok  zofran - emailed. Go to ER if sick Thx

## 2015-01-11 NOTE — Telephone Encounter (Signed)
Advised patient zofran has been called in, and to go to ED if she gets worse before her office visit

## 2015-01-17 ENCOUNTER — Encounter: Payer: Self-pay | Admitting: Gastroenterology

## 2015-01-20 ENCOUNTER — Encounter: Payer: Self-pay | Admitting: Family Medicine

## 2015-01-20 ENCOUNTER — Ambulatory Visit (INDEPENDENT_AMBULATORY_CARE_PROVIDER_SITE_OTHER): Payer: Medicare Other | Admitting: Family Medicine

## 2015-01-20 VITALS — BP 124/80 | HR 68 | Ht 68.0 in | Wt 195.0 lb

## 2015-01-20 DIAGNOSIS — M7072 Other bursitis of hip, left hip: Secondary | ICD-10-CM

## 2015-01-20 NOTE — Assessment & Plan Note (Signed)
Patient is near completely healed at this time. Patient will start to increase her activity including strength training. We discussed what activities to avoid including running and jumping. Patient with continue the compression sleeve on an as-needed basis. Patient will follow-up with me in 3-6 weeks if pain is not completely resolved.

## 2015-01-20 NOTE — Patient Instructions (Signed)
It is good to see you You are doing great Ok to do strength but 25% at first then increase 10% a week Get back in the pool compression sleeve.  See me again when you need me.

## 2015-01-20 NOTE — Progress Notes (Signed)
Pre visit review using our clinic review tool, if applicable. No additional management support is needed unless otherwise documented below in the visit note. 

## 2015-01-20 NOTE — Progress Notes (Signed)
  [facility] [address], [place] [address] Phone: [phone] Subjective:     CC: Left hip pain and buttock pain.  [id number] Sheila Hall is a 64 y.o. female coming in with complaint of patient isn't quite some pain with left buttock and hip pain. Patient was found to have more of an ischial bursitis. Patient had failed all other conservative therapy. Patient's imaging studies confirmed the diagnosis of the mild tendinosis at the ischial tuberosity. Patient elected for a PRP injection and this was 3 weeks ago. Patient states that she is feeling 90% better. No longer having the radicular symptoms down the leg and no longer having the significant buttocks pain. Patient states that she is only having mild tightness of the hamstring with certain exaggerated stretching. Patient states though that she is feeling significantly better. Has not been doing the stretching regularly due to a diverticulosis flare. Patient though is very happy with the results.     Past medical history, social, surgical and family history all reviewed in electronic medical record.   Review of Systems: No headache, visual changes, nausea, vomiting, diarrhea, constipation, dizziness, abdominal pain, skin rash, fevers, chills, night sweats, weight loss, swollen lymph nodes, body aches, joint swelling, muscle aches, chest pain, shortness of breath, mood changes.   Objective Blood pressure 124/80, pulse 68, height 5\' 8"  (1.727 m), weight 195 lb (88.451 kg), SpO2 98 %.  General: No apparent distress alert and oriented x3 mood and affect normal, dressed appropriately.  HEENT: Pupils equal, extraocular movements intact  Respiratory: Patient's speak in full sentences and does not appear short of breath  Cardiovascular: No lower extremity edema, non tender, no erythema  Skin: Warm dry intact with no signs of infection or rash on extremities or on axial skeleton.  Abdomen: Soft  nontender  Neuro: Cranial nerves II through XII are intact, neurovascularly intact in all extremities with 2+ DTRs and 2+ pulses.  Lymph: No lymphadenopathy of posterior or anterior cervical chain or axillae bilaterally.  Gait normal with good balance and coordination.  MSK:  Non tender with full range of motion and good stability and symmetric strength and tone of shoulders, elbows, wrist,   knee and ankles bilaterally.   Hip: Left ROM IR: 25 Deg, ER: 45 Deg, Flexion: 120 Deg, Extension: 100 Deg, Abduction: 45 Deg, Adduction: 45 Deg Strength IR: 5/5, ER: 5/5, Flexion: 5/5, Extension: 5/5, Abduction: 4/5, Adduction: 5/5 Pelvic alignment unremarkable to inspection and palpation. Standing hip rotation and gait without trendelenburg sign / unsteadiness. Greater trochanter without tenderness to palpation. No tenderness over piriformis and greater trochanter. Negative Faber and nontender at the ischial bursa Mild increase in tenderness over the sacroiliac joints bilaterally. contralateral hip unremarkable No Change from previous exam.      Impression and Recommendations:     This case required medical decision making of moderate complexity.

## 2015-01-24 ENCOUNTER — Ambulatory Visit (INDEPENDENT_AMBULATORY_CARE_PROVIDER_SITE_OTHER): Payer: Medicare Other | Admitting: Internal Medicine

## 2015-01-24 ENCOUNTER — Encounter: Payer: Self-pay | Admitting: Internal Medicine

## 2015-01-24 VITALS — BP 110/70 | HR 77 | Wt 196.0 lb

## 2015-01-24 DIAGNOSIS — G4700 Insomnia, unspecified: Secondary | ICD-10-CM

## 2015-01-24 DIAGNOSIS — J3089 Other allergic rhinitis: Secondary | ICD-10-CM

## 2015-01-24 DIAGNOSIS — K5730 Diverticulosis of large intestine without perforation or abscess without bleeding: Secondary | ICD-10-CM | POA: Diagnosis not present

## 2015-01-24 MED ORDER — ERGOCALCIFEROL 1.25 MG (50000 UT) PO CAPS: 50000.0000 [IU] | ORAL_CAPSULE | ORAL | Status: DC

## 2015-01-24 NOTE — Progress Notes (Signed)
   Subjective:    HPI   C/o another diverticulitis  F/u R hip pain - Dr Tamala Julian F/u insomnia The patient needs to address  chronic OA that has been well controlled with medicines; to address chronic constipation controlled with medical treatment and diet.  Retiring in June 2016   BP Readings from Last 3 Encounters:  01/24/15 110/70  01/20/15 124/80  12/30/14 140/82   Wt Readings from Last 3 Encounters:  01/24/15 196 lb (88.905 kg)  01/20/15 195 lb (88.451 kg)  12/30/14 201 lb (91.173 kg)       Review of Systems  Constitutional: Negative for chills, activity change, appetite change, fatigue and unexpected weight change.  HENT: Negative for congestion, mouth sores and sinus pressure.   Eyes: Negative for visual disturbance.  Respiratory: Negative for chest tightness and wheezing.   Genitourinary: Negative for frequency, difficulty urinating and vaginal pain.  Musculoskeletal: Negative for gait problem.  Skin: Negative for pallor and rash.  Neurological: Negative for tremors.  Psychiatric/Behavioral: Negative for confusion and sleep disturbance.       Objective:   Physical Exam  Constitutional: She appears well-developed. No distress.  HENT:  Head: Normocephalic.  Right Ear: External ear normal.  Left Ear: External ear normal.  Nose: Nose normal.  Mouth/Throat: Oropharynx is clear and moist.  Eyes: Conjunctivae are normal. Pupils are equal, round, and reactive to light. Right eye exhibits no discharge. Left eye exhibits no discharge.  Neck: Normal range of motion. Neck supple. No JVD present. No tracheal deviation present. No thyromegaly present.  Cardiovascular: Normal rate, regular rhythm and normal heart sounds.   Pulmonary/Chest: No stridor. No respiratory distress. She has no wheezes.  Abdominal: Soft. Bowel sounds are normal. She exhibits no distension and no mass. There is no tenderness. There is no rebound and no guarding.  Musculoskeletal: She exhibits no  edema or tenderness.  Lymphadenopathy:    She has no cervical adenopathy.  Neurological: She displays normal reflexes. No cranial nerve deficit. She exhibits normal muscle tone. Coordination normal.  Skin: No rash noted. No erythema.  Psychiatric: She has a normal mood and affect. Her behavior is normal. Judgment and thought content normal.  R troch major is tender  Lab Results  Component Value Date   WBC 8.8 11/15/2014   HGB 13.8 11/15/2014   HCT 41.2 11/15/2014   PLT 415.0* 11/15/2014   GLUCOSE 98 11/15/2014   CHOL 251* 02/18/2014   TRIG 235.0* 02/18/2014   HDL 54.70 02/18/2014   LDLDIRECT 106.2 09/16/2012   LDLCALC 149* 02/18/2014   ALT 34 11/15/2014   AST 27 11/15/2014   NA 138 11/15/2014   K 4.2 11/15/2014   CL 102 11/15/2014   CREATININE 0.88 11/15/2014   BUN 13 11/15/2014   CO2 31 11/15/2014   TSH 1.30 02/18/2014   HGBA1C 5.6 08/05/2007          Assessment & Plan:

## 2015-01-24 NOTE — Assessment & Plan Note (Signed)
Atarax prn

## 2015-01-24 NOTE — Assessment & Plan Note (Signed)
Zolpidem at hs prn  Potential benefits of a long term benzodiazepines  use as well as potential risks  and complications were explained to the patient and were aknowledged.

## 2015-01-24 NOTE — Progress Notes (Signed)
Pre visit review using our clinic review tool, if applicable. No additional management support is needed unless otherwise documented below in the visit note. 

## 2015-01-24 NOTE — Assessment & Plan Note (Signed)
Recurrent diverticulitis

## 2015-01-30 ENCOUNTER — Telehealth: Payer: Self-pay | Admitting: Internal Medicine

## 2015-01-30 ENCOUNTER — Other Ambulatory Visit (INDEPENDENT_AMBULATORY_CARE_PROVIDER_SITE_OTHER): Payer: Medicare Other

## 2015-01-30 DIAGNOSIS — R319 Hematuria, unspecified: Secondary | ICD-10-CM | POA: Diagnosis not present

## 2015-01-30 LAB — URINALYSIS, ROUTINE W REFLEX MICROSCOPIC
Bilirubin Urine: NEGATIVE
Ketones, ur: NEGATIVE
Nitrite: POSITIVE — AB
Total Protein, Urine: 100 — AB
Urine Glucose: NEGATIVE
Urobilinogen, UA: 1 (ref 0.0–1.0)
pH: 6 (ref 5.0–8.0)

## 2015-01-30 NOTE — Telephone Encounter (Signed)
UA ordered. Pt informed

## 2015-01-30 NOTE — Telephone Encounter (Signed)
Patient called in with a possible uti and blood in her urine. She just seen you last week and she was wondering if she can just get a lab order instead of coming in. Please advise

## 2015-01-31 MED ORDER — NITROFURANTOIN MONOHYD MACRO 100 MG PO CAPS: 100.0000 mg | ORAL_CAPSULE | Freq: Two times a day (BID) | ORAL | Status: DC

## 2015-01-31 NOTE — Telephone Encounter (Signed)
Pt called in and would like result from the UA.  She is wanting to know if meds can be called in ?     Cell number is best

## 2015-01-31 NOTE — Telephone Encounter (Signed)
Ok Macrobid 1 qd x 5 days per PCP. Need Urine cx. Pt informed

## 2015-01-31 NOTE — Addendum Note (Signed)
Addended by: Cresenciano Lick on: 01/31/2015 05:10 PM   Modules accepted: Orders

## 2015-02-01 ENCOUNTER — Other Ambulatory Visit: Payer: Medicare Other

## 2015-02-01 DIAGNOSIS — R319 Hematuria, unspecified: Secondary | ICD-10-CM | POA: Diagnosis not present

## 2015-02-03 LAB — URINE CULTURE
Colony Count: NO GROWTH
Organism ID, Bacteria: NO GROWTH

## 2015-05-09 ENCOUNTER — Ambulatory Visit (INDEPENDENT_AMBULATORY_CARE_PROVIDER_SITE_OTHER): Payer: Medicare Other | Admitting: Internal Medicine

## 2015-05-09 ENCOUNTER — Other Ambulatory Visit (INDEPENDENT_AMBULATORY_CARE_PROVIDER_SITE_OTHER): Payer: Medicare Other

## 2015-05-09 ENCOUNTER — Encounter: Payer: Self-pay | Admitting: Internal Medicine

## 2015-05-09 VITALS — BP 136/88 | HR 76 | Ht 68.0 in | Wt 189.0 lb

## 2015-05-09 DIAGNOSIS — E785 Hyperlipidemia, unspecified: Secondary | ICD-10-CM

## 2015-05-09 DIAGNOSIS — M25551 Pain in right hip: Secondary | ICD-10-CM | POA: Diagnosis not present

## 2015-05-09 DIAGNOSIS — K5733 Diverticulitis of large intestine without perforation or abscess with bleeding: Secondary | ICD-10-CM

## 2015-05-09 DIAGNOSIS — Z0000 Encounter for general adult medical examination without abnormal findings: Secondary | ICD-10-CM | POA: Diagnosis not present

## 2015-05-09 DIAGNOSIS — Z23 Encounter for immunization: Secondary | ICD-10-CM | POA: Diagnosis not present

## 2015-05-09 DIAGNOSIS — Z86010 Personal history of colonic polyps: Secondary | ICD-10-CM

## 2015-05-09 DIAGNOSIS — M25552 Pain in left hip: Secondary | ICD-10-CM | POA: Diagnosis not present

## 2015-05-09 LAB — CBC WITH DIFFERENTIAL/PLATELET
BASOS PCT: 0.9 % (ref 0.0–3.0)
Basophils Absolute: 0.1 10*3/uL (ref 0.0–0.1)
EOS ABS: 0 10*3/uL (ref 0.0–0.7)
Eosinophils Relative: 0.5 % (ref 0.0–5.0)
HCT: 42.7 % (ref 36.0–46.0)
HEMOGLOBIN: 14.3 g/dL (ref 12.0–15.0)
Lymphocytes Relative: 30.3 % (ref 12.0–46.0)
Lymphs Abs: 2.8 10*3/uL (ref 0.7–4.0)
MCHC: 33.5 g/dL (ref 30.0–36.0)
MCV: 92.9 fl (ref 78.0–100.0)
MONO ABS: 0.5 10*3/uL (ref 0.1–1.0)
Monocytes Relative: 5.7 % (ref 3.0–12.0)
Neutro Abs: 5.7 10*3/uL (ref 1.4–7.7)
Neutrophils Relative %: 62.6 % (ref 43.0–77.0)
PLATELETS: 320 10*3/uL (ref 150.0–400.0)
RBC: 4.59 Mil/uL (ref 3.87–5.11)
RDW: 13.2 % (ref 11.5–15.5)
WBC: 9.1 10*3/uL (ref 4.0–10.5)

## 2015-05-09 LAB — URINALYSIS
Bilirubin Urine: NEGATIVE
Hgb urine dipstick: NEGATIVE
KETONES UR: NEGATIVE
Leukocytes, UA: NEGATIVE
Nitrite: NEGATIVE
Specific Gravity, Urine: <=1.005 — AB (ref 1.000–1.030)
Total Protein, Urine: NEGATIVE
URINE GLUCOSE: NEGATIVE
UROBILINOGEN UA: 0.2 (ref 0.0–1.0)
pH: 6 (ref 5.0–8.0)

## 2015-05-09 LAB — HEPATIC FUNCTION PANEL
ALK PHOS: 65 U/L (ref 39–117)
ALT: 17 U/L (ref 0–35)
AST: 19 U/L (ref 0–37)
Albumin: 4.4 g/dL (ref 3.5–5.2)
BILIRUBIN DIRECT: 0.1 mg/dL (ref 0.0–0.3)
BILIRUBIN TOTAL: 0.6 mg/dL (ref 0.2–1.2)
TOTAL PROTEIN: 7.5 g/dL (ref 6.0–8.3)

## 2015-05-09 LAB — LIPID PANEL
CHOL/HDL RATIO: 4
Cholesterol: 217 mg/dL — ABNORMAL HIGH (ref 0–200)
HDL: 58.3 mg/dL (ref >39.00)
LDL CALC: 128 mg/dL — AB (ref 0–99)
NONHDL: 158.56
TRIGLYCERIDES: 153 mg/dL — AB (ref 0.0–149.0)
VLDL: 30.6 mg/dL (ref 0.0–40.0)

## 2015-05-09 LAB — BASIC METABOLIC PANEL
BUN: 12 mg/dL (ref 6–23)
CO2: 26 mEq/L (ref 19–32)
CREATININE: 0.75 mg/dL (ref 0.40–1.20)
Calcium: 9.6 mg/dL (ref 8.4–10.5)
Chloride: 103 mEq/L (ref 96–112)
GFR: 82.25 mL/min (ref >60.00)
Glucose, Bld: 88 mg/dL (ref 70–99)
Potassium: 4.2 mEq/L (ref 3.5–5.1)
Sodium: 139 mEq/L (ref 135–145)

## 2015-05-09 LAB — TSH: TSH: 0.93 u[IU]/mL (ref 0.35–4.50)

## 2015-05-09 MED ORDER — METHYLPREDNISOLONE ACETATE 80 MG/ML IJ SUSP
80.0000 mg | Freq: Once | INTRAMUSCULAR | Status: AC
  Administered 2015-05-09: 80 mg via INTRAMUSCULAR

## 2015-05-09 MED ORDER — AMOXICILLIN-POT CLAVULANATE 875-125 MG PO TABS: 1.0000 | ORAL_TABLET | Freq: Two times a day (BID) | ORAL | Status: AC

## 2015-05-09 MED ORDER — ESTRADIOL 1 MG PO TABS: 0.5000 mg | ORAL_TABLET | Freq: Every day | ORAL | Status: DC

## 2015-05-09 MED ORDER — METHYLPREDNISOLONE ACETATE 40 MG/ML IJ SUSP
80.0000 mg | Freq: Once | INTRAMUSCULAR | Status: DC
Start: 2015-05-09 — End: 2015-05-09

## 2015-05-09 MED ORDER — ZOLPIDEM TARTRATE 10 MG PO TABS: 10.0000 mg | ORAL_TABLET | Freq: Every evening | ORAL | Status: DC | PRN

## 2015-05-09 NOTE — Assessment & Plan Note (Signed)
resolved 

## 2015-05-09 NOTE — Assessment & Plan Note (Signed)
Here for medicare wellness/physical  Diet: heart healthy  Physical activity: not sedentary  Depression/mood screen: negative  Hearing: intact to whispered voice  Visual acuity: grossly normal, performs annual eye exam  ADLs: capable  Fall risk: none  Home safety: good  Cognitive evaluation: intact to orientation, naming, recall and repetition  EOL planning: adv directives, full code/ I agree  I have personally reviewed and have noted  1. The patient's medical, surgical and social history  2. Their use of alcohol, tobacco or illicit drugs  3. Their current medications and supplements  4. The patient's functional ability including ADL's, fall risks, home safety risks and hearing or visual impairment.  5. Diet and physical activities  6. Evidence for depression or mood disorders 7. The roster of all physicians providing medical care to patient - is listed in the Snapshot section of the chart and reviewed today.    Today patient counseled on age appropriate routine health concerns for screening and prevention, each reviewed and up to date or declined. Immunizations reviewed and up to date or declined. Labs ordered and reviewed. Risk factors for depression reviewed and negative. Hearing function and visual acuity are intact. ADLs screened and addressed as needed. Functional ability and level of safety reviewed and appropriate. Education, counseling and referrals performed based on assessed risks today. Patient provided with a copy of personalized plan for preventive services.   An has retired in June 2016, living in a motor home Flu shot    

## 2015-05-09 NOTE — Progress Notes (Signed)
Subjective:  Patient ID: Sheila Hall, female    DOB: 08-May-1951  Age: 64 y.o. MRN: [id number]  CC: No chief complaint on file.   HPI [name] presents for well exam. [name] has retired in June 2016, living in a motor home F/u diverticulitis, insomnia. C/o severe pain in L hip  Outpatient Prescriptions Prior to Visit  Medication Sig Dispense Refill  . albuterol (PROVENTIL HFA;VENTOLIN HFA) 108 (90 BASE) MCG/ACT inhaler Inhale 2 puffs into the lungs every 4 (four) hours as needed for wheezing.    . docusate sodium (COLACE) 100 MG capsule Take 1 capsule (100 mg total) by mouth at bedtime. 100 capsule 3  . ergocalciferol (VITAMIN D2) 50000 UNITS capsule Take 1 capsule (50,000 Units total) by mouth every 30 (thirty) days. 3 capsule 4  . gabapentin (NEURONTIN) 100 MG capsule Take 2 capsules (200 mg total) by mouth at bedtime. 60 capsule 3  . Linaclotide (LINZESS) 290 MCG CAPS capsule Take 1 capsule (290 mcg total) by mouth daily. 90 capsule 3  . nitrofurantoin, macrocrystal-monohydrate, (MACROBID) 100 MG capsule Take 1 capsule (100 mg total) by mouth 2 (two) times daily. 10 capsule 0  . omeprazole (PRILOSEC) 40 MG capsule Take 1 capsule (40 mg total) by mouth daily. 90 capsule 3  . traMADol (ULTRAM) 50 MG tablet Take 1 tablet (50 mg total) by mouth 2 (two) times daily as needed. 120 tablet 3  . estradiol (ESTRACE) 1 MG tablet Take 0.5 tablets (0.5 mg total) by mouth daily. 45 tablet 2  . zolpidem (AMBIEN) 5 MG tablet Take 1 tablet (5 mg total) by mouth at bedtime as needed for sleep. 45 tablet 2  . furosemide (LASIX) 20 MG tablet Take 1-2 tablets (20-40 mg total) by mouth daily as needed for edema. (Patient not taking: Reported on 05/09/2015) 180 tablet 3  . hydrocortisone (ANUSOL-HC) 2.5 % rectal cream Place rectally 2 (two) times daily. (Patient not taking: Reported on 05/09/2015) 30 g 1  . hydrOXYzine (ATARAX/VISTARIL) 50 MG tablet Take 1 tablet (50 mg total) by mouth at  bedtime as needed for itching (or sleep). (Patient not taking: Reported on 05/09/2015) 30 tablet 3  . ondansetron (ZOFRAN) 4 MG tablet Take 1 tablet (4 mg total) by mouth every 8 (eight) hours as needed for nausea or vomiting. (Patient not taking: Reported on 05/09/2015) 20 tablet 0  . potassium chloride (KLOR-CON) 8 MEQ tablet Take 1 tablet (8 mEq total) by mouth daily as needed. Take with Lasix (Patient not taking: Reported on 05/09/2015) 90 tablet 3   Facility-Administered Medications Prior to Visit  Medication Dose Route Frequency Provider Last Rate Last Dose  . DOBUTamine (DOBUTREX) 1,000 mcg/mL in sodium chloride 0.9 % 150 mL infusion  40 mcg/kg Intravenous Continuous Tanae Petrosky V, MD   40 mcg/kg/min at 06/09/12 1530    ROS Review of Systems  Constitutional: Negative for chills, activity change, appetite change, fatigue and unexpected weight change.  HENT: Negative for congestion, mouth sores and sinus pressure.   Eyes: Negative for visual disturbance.  Respiratory: Negative for cough and chest tightness.   Gastrointestinal: Negative for nausea and abdominal pain.  Genitourinary: Negative for frequency, difficulty urinating and vaginal pain.  Musculoskeletal: Negative for back pain and gait problem.  Skin: Negative for pallor and rash.  Neurological: Negative for dizziness, tremors, weakness, numbness and headaches.  Psychiatric/Behavioral: Negative for suicidal ideas, confusion and sleep disturbance.    Objective:  BP 136/88 mmHg  Pulse 76  Ht 5'  8" (1.727 m)  Wt 189 lb (85.73 kg)  BMI 28.74 kg/m2  SpO2 98%  BP Readings from Last 3 Encounters:  05/09/15 136/88  01/24/15 110/70  01/20/15 124/80    Wt Readings from Last 3 Encounters:  05/09/15 189 lb (85.73 kg)  01/24/15 196 lb (88.905 kg)  01/20/15 195 lb (88.451 kg)    Physical Exam  Constitutional: She appears well-developed. No distress.  HENT:  Head: Normocephalic.  Right Ear: External ear normal.  Left  Ear: External ear normal.  Nose: Nose normal.  Mouth/Throat: Oropharynx is clear and moist.  Eyes: Conjunctivae are normal. Pupils are equal, round, and reactive to light. Right eye exhibits no discharge. Left eye exhibits no discharge.  Neck: Normal range of motion. Neck supple. No JVD present. No tracheal deviation present. No thyromegaly present.  Cardiovascular: Normal rate, regular rhythm and normal heart sounds.   Pulmonary/Chest: No stridor. No respiratory distress. She has no wheezes.  Abdominal: Soft. Bowel sounds are normal. She exhibits no distension and no mass. There is no tenderness. There is no rebound and no guarding.  Musculoskeletal: She exhibits no edema or tenderness.  Lymphadenopathy:    She has no cervical adenopathy.  Neurological: She displays normal reflexes. No cranial nerve deficit. She exhibits normal muscle tone. Coordination normal.  Skin: No rash noted. No erythema.  Psychiatric: She has a normal mood and affect. Her behavior is normal. Judgment and thought content normal.  L troch major is tender  Lab Results  Component Value Date   WBC 9.1 05/09/2015   HGB 14.3 05/09/2015   HCT 42.7 05/09/2015   PLT 320.0 05/09/2015   GLUCOSE 88 05/09/2015   CHOL 217* 05/09/2015   TRIG 153.0* 05/09/2015   HDL 58.30 05/09/2015   LDLDIRECT 106.2 09/16/2012   LDLCALC 128* 05/09/2015   ALT 17 05/09/2015   AST 19 05/09/2015   NA 139 05/09/2015   K 4.2 05/09/2015   CL 103 05/09/2015   CREATININE 0.75 05/09/2015   BUN 12 05/09/2015   CO2 26 05/09/2015   TSH 0.93 05/09/2015   HGBA1C 5.6 08/05/2007     Procedure : Joint Injection,   L hip   Indication:  Trochanteric bursitis with refractory  chronic pain.   Risks including unsuccessful procedure , bleeding, infection, bruising, skin atrophy, "steroid flare-up" and others were explained to the patient in detail as well as the benefits. Informed consent was obtained and signed.   Tthe patient was placed in a  comfortable lateral decubitus position. The point of maximal tenderness was identified. Skin was prepped with Betadine and alcohol. Then, a 5 cc syringe with a 2 inch long 24-gauge needle was used for a bursa injection.. The needle was advanced  Into the bursa. I injected the bursa with 4 mL of 2% lidocaine and 80 mg of Depo-Medrol .  Band-Aid was applied.   Tolerated well. Complications: None. Good pain relief following the procedure.   Postprocedure instructions :    A Band-Aid should be left on for 12 hours. Injection therapy is not a cure itself. It is used in conjunction with other modalities. You can use nonsteroidal anti-inflammatories like ibuprofen , hot and cold compresses. Rest is recommended in the next 24 hours. You need to report immediately  if fever, chills or any signs of infection develop. Mr Pelvis W Wo Contrast  11/19/2014   CLINICAL DATA:  64 year old female with 3 week history of constant left buttock pain and left hip pain. History of diverticulitis.  EXAM: MRI PELVIS WITHOUT AND WITH CONTRAST  TECHNIQUE: Multiplanar multisequence MR imaging of the pelvis was performed both before and after administration of intravenous contrast.  CONTRAST:  84mL MULTIHANCE GADOBENATE DIMEGLUMINE 529 MG/ML IV SOLN  COMPARISON:  CT of the abdomen and pelvis 11/16/2014.  FINDINGS: Urinary Tract: Urinary bladder is normal in appearance. Distal ureters do not appear dilated.  Bowel: Numerous colonic diverticulae in the sigmoid colon. No overt surrounding inflammatory changes to suggest an acute diverticulitis at this time in the visualized portions of the pelvis. Visualized portions of the small bowel do not appear dilated.  Vascular/Lymphatic: No significant atherosclerotic disease or aneurysm identified in the abdominal or pelvic vasculature. No lymphadenopathy in the visualized pelvis.  Reproductive: Status post hysterectomy.  Ovaries are atrophic.  Other: No significant volume of ascites.   Musculoskeletal: Refer to separate dictation for musculoskeletal MRI pelvis 11/18/2014.  IMPRESSION: 1. No acute findings in the pelvis. 2. Sigmoid diverticulosis without evidence of acute diverticulitis at this time. 3. Additional incidental findings, as above.   Electronically Signed   By: Vinnie Langton M.D.   On: 11/19/2014 12:01   Mr Hip Left W Wo Contrast  11/19/2014   CLINICAL DATA:  Left hip pain with bending.  EXAM: MRI OF THE LEFT HIP WITHOUT AND WITH CONTRAST  TECHNIQUE: Multiplanar, multisequence MR imaging was performed both before and after administration of intravenous contrast.  CONTRAST:  16mL MULTIHANCE GADOBENATE DIMEGLUMINE 529 MG/ML IV SOLN  COMPARISON:  None.  FINDINGS: Bones:  No fracture, dislocation or avascular necrosis.  Mild degenerative changes of bilateral SI joints.  No other focal marrow signal abnormality.  Articular cartilage and labrum  Articular cartilage: Partial thickness cartilage loss of the superior left acetabulum. Mild left femoral head chondromalacia.  Labrum: Grossly intact, but evaluation is limited by lack of intraarticular fluid.  Joint or bursal effusion  Joint effusion:  No significant joint effusion.  Bursae:  No bursa formation.  Muscles and tendons  Flexors: Normal.  Extensors: Normal.  Abductors: Normal.  Adductors: Normal.  Rotators: Normal.  Hamstrings: Mild edema within the ischial tuberosity at the hamstring insertion with tendinosis of the left hamstring origin.  Other findings  Miscellaneous: Diverticulosis of the sigmoid colon. No pelvic free fluid.  IMPRESSION: 1. Mild tendinosis of the left hamstring origin with mild edema in the ischial tuberosity. 2. No hip fracture, dislocation or avascular necrosis. 3. Mild osteoarthritis of the left hip. 4. Diverticulosis of the sigmoid colon.   Electronically Signed   By: Kathreen Devoid   On: 11/19/2014 08:30    Assessment & Plan:   Diagnoses and all orders for this visit:  Well adult exam -     Basic  metabolic panel; Future -     CBC with Differential/Platelet; Future -     Hepatic function panel; Future -     Lipid panel; Future -     TSH; Future -     Urinalysis; Future  History of colonic polyps -     Ambulatory referral to Gastroenterology -     CBC with Differential/Platelet; Future  Diverticulitis of large intestine without perforation or abscess with bleeding -     amoxicillin-clavulanate (AUGMENTIN) 875-125 MG per tablet; Take 1 tablet by mouth 2 (two) times daily. -     CBC with Differential/Platelet; Future -     TSH; Future  Right hip pain -     CBC with Differential/Platelet; Future  Left hip pain -  CBC with Differential/Platelet; Future -     Discontinue: methylPREDNISolone acetate (DEPO-MEDROL) injection 80 mg; Inject 2 mLs (80 mg total) into the articular space once. -     methylPREDNISolone acetate (DEPO-MEDROL) injection 80 mg; Inject 1 mL (80 mg total) into the muscle once.  Dyslipidemia -     CBC with Differential/Platelet; Future -     Lipid panel; Future -     TSH; Future  Need for influenza vaccination -     Flu Vaccine QUAD 36+ mos IM  Other orders -     zolpidem (AMBIEN) 10 MG tablet; Take 1 tablet (10 mg total) by mouth at bedtime as needed for sleep. -     estradiol (ESTRACE) 1 MG tablet; Take 0.5 tablets (0.5 mg total) by mouth daily.   I have discontinued Ms. Sirek's zolpidem. I am also having her start on zolpidem. Additionally, I am having her maintain her albuterol, docusate sodium, Linaclotide, traMADol, furosemide, potassium chloride, hydrocortisone, omeprazole, hydrOXYzine, gabapentin, ondansetron, ergocalciferol, nitrofurantoin (macrocrystal-monohydrate), amoxicillin-clavulanate, and estradiol. We administered methylPREDNISolone acetate.  Meds ordered this encounter  Medications  . zolpidem (AMBIEN) 10 MG tablet    Sig: Take 1 tablet (10 mg total) by mouth at bedtime as needed for sleep.    Dispense:  90 tablet    Refill:  1    . amoxicillin-clavulanate (AUGMENTIN) 875-125 MG per tablet    Sig: Take 1 tablet by mouth 2 (two) times daily.    Dispense:  20 tablet    Refill:  0  . estradiol (ESTRACE) 1 MG tablet    Sig: Take 0.5 tablets (0.5 mg total) by mouth daily.    Dispense:  45 tablet    Refill:  2  . DISCONTD: methylPREDNISolone acetate (DEPO-MEDROL) injection 80 mg    Sig:   . methylPREDNISolone acetate (DEPO-MEDROL) injection 80 mg    Sig:      Follow-up: Return in about 6 months (around 11/07/2015) for a follow-up visit.  Walker Kehr, MD

## 2015-05-09 NOTE — Patient Instructions (Signed)
Preventive Care for Adults A healthy lifestyle and preventive care can promote health and wellness. Preventive health guidelines for women include the following key practices.  A routine yearly physical is a good way to check with your health care provider about your health and preventive screening. It is a chance to share any concerns and updates on your health and to receive a thorough exam.  Visit your dentist for a routine exam and preventive care every 6 months. Brush your teeth twice a day and floss once a day. Good oral hygiene prevents tooth decay and gum disease.  The frequency of eye exams is based on your age, health, family medical history, use of contact lenses, and other factors. Follow your health care provider's recommendations for frequency of eye exams.  Eat a healthy diet. Foods like vegetables, fruits, whole grains, low-fat dairy products, and lean protein foods contain the nutrients you need without too many calories. Decrease your intake of foods high in solid fats, added sugars, and salt. Eat the right amount of calories for you.Get information about a proper diet from your health care provider, if necessary.  Regular physical exercise is one of the most important things you can do for your health. Most adults should get at least 150 minutes of moderate-intensity exercise (any activity that increases your heart rate and causes you to sweat) each week. In addition, most adults need muscle-strengthening exercises on 2 or more days a week.  Maintain a healthy weight. The body mass index (BMI) is a screening tool to identify possible weight problems. It provides an estimate of body fat based on height and weight. Your health care provider can find your BMI and can help you achieve or maintain a healthy weight.For adults 20 years and older:  A BMI below 18.5 is considered underweight.  A BMI of 18.5 to 24.9 is normal.  A BMI of 25 to 29.9 is considered overweight.  A BMI of  30 and above is considered obese.  Maintain normal blood lipids and cholesterol levels by exercising and minimizing your intake of saturated fat. Eat a balanced diet with plenty of fruit and vegetables. Blood tests for lipids and cholesterol should begin at age 76 and be repeated every 5 years. If your lipid or cholesterol levels are high, you are over 50, or you are at high risk for heart disease, you may need your cholesterol levels checked more frequently.Ongoing high lipid and cholesterol levels should be treated with medicines if diet and exercise are not working.  If you smoke, find out from your health care provider how to quit. If you do not use tobacco, do not start.  Lung cancer screening is recommended for adults aged 22-80 years who are at high risk for developing lung cancer because of a history of smoking. A yearly low-dose CT scan of the lungs is recommended for people who have at least a 30-pack-year history of smoking and are a current smoker or have quit within the past 15 years. A pack year of smoking is smoking an average of 1 pack of cigarettes a day for 1 year (for example: 1 pack a day for 30 years or 2 packs a day for 15 years). Yearly screening should continue until the smoker has stopped smoking for at least 15 years. Yearly screening should be stopped for people who develop a health problem that would prevent them from having lung cancer treatment.  If you are pregnant, do not drink alcohol. If you are breastfeeding,  be very cautious about drinking alcohol. If you are not pregnant and choose to drink alcohol, do not have more than 1 drink per day. One drink is considered to be 12 ounces (355 mL) of beer, 5 ounces (148 mL) of wine, or 1.5 ounces (44 mL) of liquor.  Avoid use of street drugs. Do not share needles with anyone. Ask for help if you need support or instructions about stopping the use of drugs.  High blood pressure causes heart disease and increases the risk of  stroke. Your blood pressure should be checked at least every 1 to 2 years. Ongoing high blood pressure should be treated with medicines if weight loss and exercise do not work.  If you are 75-52 years old, ask your health care provider if you should take aspirin to prevent strokes.  Diabetes screening involves taking a blood sample to check your fasting blood sugar level. This should be done once every 3 years, after age 15, if you are within normal weight and without risk factors for diabetes. Testing should be considered at a younger age or be carried out more frequently if you are overweight and have at least 1 risk factor for diabetes.  Breast cancer screening is essential preventive care for women. You should practice "breast self-awareness." This means understanding the normal appearance and feel of your breasts and may include breast self-examination. Any changes detected, no matter how small, should be reported to a health care provider. Women in their 58s and 30s should have a clinical breast exam (CBE) by a health care provider as part of a regular health exam every 1 to 3 years. After age 16, women should have a CBE every year. Starting at age 53, women should consider having a mammogram (breast X-ray test) every year. Women who have a family history of breast cancer should talk to their health care provider about genetic screening. Women at a high risk of breast cancer should talk to their health care providers about having an MRI and a mammogram every year.  Breast cancer gene (BRCA)-related cancer risk assessment is recommended for women who have family members with BRCA-related cancers. BRCA-related cancers include breast, ovarian, tubal, and peritoneal cancers. Having family members with these cancers may be associated with an increased risk for harmful changes (mutations) in the breast cancer genes BRCA1 and BRCA2. Results of the assessment will determine the need for genetic counseling and  BRCA1 and BRCA2 testing.  Routine pelvic exams to screen for cancer are no longer recommended for nonpregnant women who are considered low risk for cancer of the pelvic organs (ovaries, uterus, and vagina) and who do not have symptoms. Ask your health care provider if a screening pelvic exam is right for you.  If you have had past treatment for cervical cancer or a condition that could lead to cancer, you need Pap tests and screening for cancer for at least 20 years after your treatment. If Pap tests have been discontinued, your risk factors (such as having a new sexual partner) need to be reassessed to determine if screening should be resumed. Some women have medical problems that increase the chance of getting cervical cancer. In these cases, your health care provider may recommend more frequent screening and Pap tests.  The HPV test is an additional test that may be used for cervical cancer screening. The HPV test looks for the virus that can cause the cell changes on the cervix. The cells collected during the Pap test can be  tested for HPV. The HPV test could be used to screen women aged 30 years and older, and should be used in women of any age who have unclear Pap test results. After the age of 30, women should have HPV testing at the same frequency as a Pap test.  Colorectal cancer can be detected and often prevented. Most routine colorectal cancer screening begins at the age of 50 years and continues through age 75 years. However, your health care provider may recommend screening at an earlier age if you have risk factors for colon cancer. On a yearly basis, your health care provider may provide home test kits to check for hidden blood in the stool. Use of a small camera at the end of a tube, to directly examine the colon (sigmoidoscopy or colonoscopy), can detect the earliest forms of colorectal cancer. Talk to your health care provider about this at age 50, when routine screening begins. Direct  exam of the colon should be repeated every 5-10 years through age 75 years, unless early forms of pre-cancerous polyps or small growths are found.  People who are at an increased risk for hepatitis B should be screened for this virus. You are considered at high risk for hepatitis B if:  You were born in a country where hepatitis B occurs often. Talk with your health care provider about which countries are considered high risk.  Your parents were born in a high-risk country and you have not received a shot to protect against hepatitis B (hepatitis B vaccine).  You have HIV or AIDS.  You use needles to inject street drugs.  You live with, or have sex with, someone who has hepatitis B.  You get hemodialysis treatment.  You take certain medicines for conditions like cancer, organ transplantation, and autoimmune conditions.  Hepatitis C blood testing is recommended for all people born from 1945 through 1965 and any individual with known risks for hepatitis C.  Practice safe sex. Use condoms and avoid high-risk sexual practices to reduce the spread of sexually transmitted infections (STIs). STIs include gonorrhea, chlamydia, syphilis, trichomonas, herpes, HPV, and human immunodeficiency virus (HIV). Herpes, HIV, and HPV are viral illnesses that have no cure. They can result in disability, cancer, and death.  You should be screened for sexually transmitted illnesses (STIs) including gonorrhea and chlamydia if:  You are sexually active and are younger than 24 years.  You are older than 24 years and your health care provider tells you that you are at risk for this type of infection.  Your sexual activity has changed since you were last screened and you are at an increased risk for chlamydia or gonorrhea. Ask your health care provider if you are at risk.  If you are at risk of being infected with HIV, it is recommended that you take a prescription medicine daily to prevent HIV infection. This is  called preexposure prophylaxis (PrEP). You are considered at risk if:  You are a heterosexual woman, are sexually active, and are at increased risk for HIV infection.  You take drugs by injection.  You are sexually active with a partner who has HIV.  Talk with your health care provider about whether you are at high risk of being infected with HIV. If you choose to begin PrEP, you should first be tested for HIV. You should then be tested every 3 months for as long as you are taking PrEP.  Osteoporosis is a disease in which the bones lose minerals and strength   with aging. This can result in serious bone fractures or breaks. The risk of osteoporosis can be identified using a bone density scan. Women ages 65 years and over and women at risk for fractures or osteoporosis should discuss screening with their health care providers. Ask your health care provider whether you should take a calcium supplement or vitamin D to reduce the rate of osteoporosis.  Menopause can be associated with physical symptoms and risks. Hormone replacement therapy is available to decrease symptoms and risks. You should talk to your health care provider about whether hormone replacement therapy is right for you.  Use sunscreen. Apply sunscreen liberally and repeatedly throughout the day. You should seek shade when your shadow is shorter than you. Protect yourself by wearing long sleeves, pants, a wide-brimmed hat, and sunglasses year round, whenever you are outdoors.  Once a month, do a whole body skin exam, using a mirror to look at the skin on your back. Tell your health care provider of new moles, moles that have irregular borders, moles that are larger than a pencil eraser, or moles that have changed in shape or color.  Stay current with required vaccines (immunizations).  Influenza vaccine. All adults should be immunized every year.  Tetanus, diphtheria, and acellular pertussis (Td, Tdap) vaccine. Pregnant women should  receive 1 dose of Tdap vaccine during each pregnancy. The dose should be obtained regardless of the length of time since the last dose. Immunization is preferred during the 27th-36th week of gestation. An adult who has not previously received Tdap or who does not know her vaccine status should receive 1 dose of Tdap. This initial dose should be followed by tetanus and diphtheria toxoids (Td) booster doses every 10 years. Adults with an unknown or incomplete history of completing a 3-dose immunization series with Td-containing vaccines should begin or complete a primary immunization series including a Tdap dose. Adults should receive a Td booster every 10 years.  Varicella vaccine. An adult without evidence of immunity to varicella should receive 2 doses or a second dose if she has previously received 1 dose. Pregnant females who do not have evidence of immunity should receive the first dose after pregnancy. This first dose should be obtained before leaving the health care facility. The second dose should be obtained 4-8 weeks after the first dose.  Human papillomavirus (HPV) vaccine. Females aged 13-26 years who have not received the vaccine previously should obtain the 3-dose series. The vaccine is not recommended for use in pregnant females. However, pregnancy testing is not needed before receiving a dose. If a female is found to be pregnant after receiving a dose, no treatment is needed. In that case, the remaining doses should be delayed until after the pregnancy. Immunization is recommended for any person with an immunocompromised condition through the age of 26 years if she did not get any or all doses earlier. During the 3-dose series, the second dose should be obtained 4-8 weeks after the first dose. The third dose should be obtained 24 weeks after the first dose and 16 weeks after the second dose.  Zoster vaccine. One dose is recommended for adults aged 60 years or older unless certain conditions are  present.  Measles, mumps, and rubella (MMR) vaccine. Adults born before 1957 generally are considered immune to measles and mumps. Adults born in 1957 or later should have 1 or more doses of MMR vaccine unless there is a contraindication to the vaccine or there is laboratory evidence of immunity to   each of the three diseases. A routine second dose of MMR vaccine should be obtained at least 28 days after the first dose for students attending postsecondary schools, health care workers, or international travelers. People who received inactivated measles vaccine or an unknown type of measles vaccine during 1963-1967 should receive 2 doses of MMR vaccine. People who received inactivated mumps vaccine or an unknown type of mumps vaccine before 1979 and are at high risk for mumps infection should consider immunization with 2 doses of MMR vaccine. For females of childbearing age, rubella immunity should be determined. If there is no evidence of immunity, females who are not pregnant should be vaccinated. If there is no evidence of immunity, females who are pregnant should delay immunization until after pregnancy. Unvaccinated health care workers born before 1957 who lack laboratory evidence of measles, mumps, or rubella immunity or laboratory confirmation of disease should consider measles and mumps immunization with 2 doses of MMR vaccine or rubella immunization with 1 dose of MMR vaccine.  Pneumococcal 13-valent conjugate (PCV13) vaccine. When indicated, a person who is uncertain of her immunization history and has no record of immunization should receive the PCV13 vaccine. An adult aged 19 years or older who has certain medical conditions and has not been previously immunized should receive 1 dose of PCV13 vaccine. This PCV13 should be followed with a dose of pneumococcal polysaccharide (PPSV23) vaccine. The PPSV23 vaccine dose should be obtained at least 8 weeks after the dose of PCV13 vaccine. An adult aged 19  years or older who has certain medical conditions and previously received 1 or more doses of PPSV23 vaccine should receive 1 dose of PCV13. The PCV13 vaccine dose should be obtained 1 or more years after the last PPSV23 vaccine dose.  Pneumococcal polysaccharide (PPSV23) vaccine. When PCV13 is also indicated, PCV13 should be obtained first. All adults aged 65 years and older should be immunized. An adult younger than age 65 years who has certain medical conditions should be immunized. Any person who resides in a nursing home or long-term care facility should be immunized. An adult smoker should be immunized. People with an immunocompromised condition and certain other conditions should receive both PCV13 and PPSV23 vaccines. People with human immunodeficiency virus (HIV) infection should be immunized as soon as possible after diagnosis. Immunization during chemotherapy or radiation therapy should be avoided. Routine use of PPSV23 vaccine is not recommended for American Indians, [place] Natives, or people younger than 65 years unless there are medical conditions that require PPSV23 vaccine. When indicated, people who have unknown immunization and have no record of immunization should receive PPSV23 vaccine. One-time revaccination 5 years after the first dose of PPSV23 is recommended for people aged 19-64 years who have chronic kidney failure, nephrotic syndrome, asplenia, or immunocompromised conditions. People who received 1-2 doses of PPSV23 before age 65 years should receive another dose of PPSV23 vaccine at age 65 years or later if at least 5 years have passed since the previous dose. Doses of PPSV23 are not needed for people immunized with PPSV23 at or after age 65 years.  Meningococcal vaccine. Adults with asplenia or persistent complement component deficiencies should receive 2 doses of quadrivalent meningococcal conjugate (MenACWY-D) vaccine. The doses should be obtained at least 2 months apart.  Microbiologists working with certain meningococcal bacteria, military recruits, people at risk during an outbreak, and people who travel to or live in countries with a high rate of meningitis should be immunized. A first-year college student up through age   21 years who is living in a residence hall should receive a dose if she did not receive a dose on or after her 16th birthday. Adults who have certain high-risk conditions should receive one or more doses of vaccine.  Hepatitis A vaccine. Adults who wish to be protected from this disease, have certain high-risk conditions, work with hepatitis A-infected animals, work in hepatitis A research labs, or travel to or work in countries with a high rate of hepatitis A should be immunized. Adults who were previously unvaccinated and who anticipate close contact with an international adoptee during the first 60 days after arrival in the [place] from a country with a high rate of hepatitis A should be immunized.  Hepatitis B vaccine. Adults who wish to be protected from this disease, have certain high-risk conditions, may be exposed to blood or other infectious body fluids, are household contacts or sex partners of hepatitis B positive people, are clients or workers in certain care facilities, or travel to or work in countries with a high rate of hepatitis B should be immunized.  Haemophilus influenzae type b (Hib) vaccine. A previously unvaccinated person with asplenia or sickle cell disease or having a scheduled splenectomy should receive 1 dose of Hib vaccine. Regardless of previous immunization, a recipient of a hematopoietic stem cell transplant should receive a 3-dose series 6-12 months after her successful transplant. Hib vaccine is not recommended for adults with HIV infection. Preventive Services / Frequency Ages 64 to 68 years  Blood pressure check.** / Every 1 to 2 years.  Lipid and cholesterol check.** / Every 5 years beginning at age  22.  Clinical breast exam.** / Every 3 years for women in their 88s and 53s.  BRCA-related cancer risk assessment.** / For women who have family members with a BRCA-related cancer (breast, ovarian, tubal, or peritoneal cancers).  Pap test.** / Every 2 years from ages 90 through 51. Every 3 years starting at age 21 through age 56 or 3 with a history of 3 consecutive normal Pap tests.  HPV screening.** / Every 3 years from ages 24 through ages 1 to 46 with a history of 3 consecutive normal Pap tests.  Hepatitis C blood test.** / For any individual with known risks for hepatitis C.  Skin self-exam. / Monthly.  Influenza vaccine. / Every year.  Tetanus, diphtheria, and acellular pertussis (Tdap, Td) vaccine.** / Consult your health care provider. Pregnant women should receive 1 dose of Tdap vaccine during each pregnancy. 1 dose of Td every 10 years.  Varicella vaccine.** / Consult your health care provider. Pregnant females who do not have evidence of immunity should receive the first dose after pregnancy.  HPV vaccine. / 3 doses over 6 months, if 72 and younger. The vaccine is not recommended for use in pregnant females. However, pregnancy testing is not needed before receiving a dose.  Measles, mumps, rubella (MMR) vaccine.** / You need at least 1 dose of MMR if you were born in 1957 or later. You may also need a 2nd dose. For females of childbearing age, rubella immunity should be determined. If there is no evidence of immunity, females who are not pregnant should be vaccinated. If there is no evidence of immunity, females who are pregnant should delay immunization until after pregnancy.  Pneumococcal 13-valent conjugate (PCV13) vaccine.** / Consult your health care provider.  Pneumococcal polysaccharide (PPSV23) vaccine.** / 1 to 2 doses if you smoke cigarettes or if you have certain conditions.  Meningococcal vaccine.** /  1 dose if you are age 19 to 21 years and a first-year college  student living in a residence hall, or have one of several medical conditions, you need to get vaccinated against meningococcal disease. You may also need additional booster doses.  Hepatitis A vaccine.** / Consult your health care provider.  Hepatitis B vaccine.** / Consult your health care provider.  Haemophilus influenzae type b (Hib) vaccine.** / Consult your health care provider. Ages 40 to 64 years  Blood pressure check.** / Every 1 to 2 years.  Lipid and cholesterol check.** / Every 5 years beginning at age 20 years.  Lung cancer screening. / Every year if you are aged 55-80 years and have a 30-pack-year history of smoking and currently smoke or have quit within the past 15 years. Yearly screening is stopped once you have quit smoking for at least 15 years or develop a health problem that would prevent you from having lung cancer treatment.  Clinical breast exam.** / Every year after age 40 years.  BRCA-related cancer risk assessment.** / For women who have family members with a BRCA-related cancer (breast, ovarian, tubal, or peritoneal cancers).  Mammogram.** / Every year beginning at age 40 years and continuing for as long as you are in good health. Consult with your health care provider.  Pap test.** / Every 3 years starting at age 30 years through age 65 or 70 years with a history of 3 consecutive normal Pap tests.  HPV screening.** / Every 3 years from ages 30 years through ages 65 to 70 years with a history of 3 consecutive normal Pap tests.  Fecal occult blood test (FOBT) of stool. / Every year beginning at age 50 years and continuing until age 75 years. You may not need to do this test if you get a colonoscopy every 10 years.  Flexible sigmoidoscopy or colonoscopy.** / Every 5 years for a flexible sigmoidoscopy or every 10 years for a colonoscopy beginning at age 50 years and continuing until age 75 years.  Hepatitis C blood test.** / For all people born from 1945 through  1965 and any individual with known risks for hepatitis C.  Skin self-exam. / Monthly.  Influenza vaccine. / Every year.  Tetanus, diphtheria, and acellular pertussis (Tdap/Td) vaccine.** / Consult your health care provider. Pregnant women should receive 1 dose of Tdap vaccine during each pregnancy. 1 dose of Td every 10 years.  Varicella vaccine.** / Consult your health care provider. Pregnant females who do not have evidence of immunity should receive the first dose after pregnancy.  Zoster vaccine.** / 1 dose for adults aged 60 years or older.  Measles, mumps, rubella (MMR) vaccine.** / You need at least 1 dose of MMR if you were born in 1957 or later. You may also need a 2nd dose. For females of childbearing age, rubella immunity should be determined. If there is no evidence of immunity, females who are not pregnant should be vaccinated. If there is no evidence of immunity, females who are pregnant should delay immunization until after pregnancy.  Pneumococcal 13-valent conjugate (PCV13) vaccine.** / Consult your health care provider.  Pneumococcal polysaccharide (PPSV23) vaccine.** / 1 to 2 doses if you smoke cigarettes or if you have certain conditions.  Meningococcal vaccine.** / Consult your health care provider.  Hepatitis A vaccine.** / Consult your health care provider.  Hepatitis B vaccine.** / Consult your health care provider.  Haemophilus influenzae type b (Hib) vaccine.** / Consult your health care provider. Ages 65   years and over  Blood pressure check.** / Every 1 to 2 years.  Lipid and cholesterol check.** / Every 5 years beginning at age 22 years.  Lung cancer screening. / Every year if you are aged 73-80 years and have a 30-pack-year history of smoking and currently smoke or have quit within the past 15 years. Yearly screening is stopped once you have quit smoking for at least 15 years or develop a health problem that would prevent you from having lung cancer  treatment.  Clinical breast exam.** / Every year after age 4 years.  BRCA-related cancer risk assessment.** / For women who have family members with a BRCA-related cancer (breast, ovarian, tubal, or peritoneal cancers).  Mammogram.** / Every year beginning at age 40 years and continuing for as long as you are in good health. Consult with your health care provider.  Pap test.** / Every 3 years starting at age 9 years through age 34 or 91 years with 3 consecutive normal Pap tests. Testing can be stopped between 65 and 70 years with 3 consecutive normal Pap tests and no abnormal Pap or HPV tests in the past 10 years.  HPV screening.** / Every 3 years from ages 57 years through ages 64 or 45 years with a history of 3 consecutive normal Pap tests. Testing can be stopped between 65 and 70 years with 3 consecutive normal Pap tests and no abnormal Pap or HPV tests in the past 10 years.  Fecal occult blood test (FOBT) of stool. / Every year beginning at age 15 years and continuing until age 17 years. You may not need to do this test if you get a colonoscopy every 10 years.  Flexible sigmoidoscopy or colonoscopy.** / Every 5 years for a flexible sigmoidoscopy or every 10 years for a colonoscopy beginning at age 86 years and continuing until age 71 years.  Hepatitis C blood test.** / For all people born from [birth year] through 1967 and any individual with known risks for hepatitis C.  Osteoporosis screening.** / A one-time screening for women ages 83 years and over and women at risk for fractures or osteoporosis.  Skin self-exam. / Monthly.  Influenza vaccine. / Every year.  Tetanus, diphtheria, and acellular pertussis (Tdap/Td) vaccine.** / 1 dose of Td every 10 years.  Varicella vaccine.** / Consult your health care provider.  Zoster vaccine.** / 1 dose for adults aged 61 years or older.  Pneumococcal 13-valent conjugate (PCV13) vaccine.** / Consult your health care provider.  Pneumococcal  polysaccharide (PPSV23) vaccine.** / 1 dose for all adults aged 28 years and older.  Meningococcal vaccine.** / Consult your health care provider.  Hepatitis A vaccine.** / Consult your health care provider.  Hepatitis B vaccine.** / Consult your health care provider.  Haemophilus influenzae type b (Hib) vaccine.** / Consult your health care provider. ** Family history and personal history of risk and conditions may change your health care provider's recommendations. Document Released: 10/22/2001 Document Revised: 01/10/2014 Document Reviewed: 01/21/2011 [facility] Patient Information 2015 [place], [place]. This information is not intended to replace advice given to you by your health care provider. Make sure you discuss any questions you have with your health care provider.

## 2015-05-09 NOTE — Assessment & Plan Note (Signed)
Last colon 2011 Dr Fuller Plan - due in 2016 Will sch

## 2015-05-09 NOTE — Assessment & Plan Note (Addendum)
Tramadol prn  Potential benefits of a long term opioids use as well as potential risks (i.e. addiction risk, apnea etc) and complications (i.e. Somnolence, constipation and others) were explained to the patient and were aknowledged.  See Procedure

## 2015-05-09 NOTE — Progress Notes (Signed)
Pre visit review using our clinic review tool, if applicable. No additional management support is needed unless otherwise documented below in the visit note. 

## 2015-06-15 ENCOUNTER — Encounter: Payer: Self-pay | Admitting: Gastroenterology

## 2015-08-05 DIAGNOSIS — Z79899 Other long term (current) drug therapy: Secondary | ICD-10-CM | POA: Diagnosis not present

## 2015-08-05 DIAGNOSIS — J040 Acute laryngitis: Secondary | ICD-10-CM | POA: Diagnosis not present

## 2015-08-05 DIAGNOSIS — Z7952 Long term (current) use of systemic steroids: Secondary | ICD-10-CM | POA: Diagnosis not present

## 2015-08-16 ENCOUNTER — Ambulatory Visit (AMBULATORY_SURGERY_CENTER): Payer: Self-pay

## 2015-08-16 VITALS — Ht 68.0 in | Wt 181.8 lb

## 2015-08-16 DIAGNOSIS — Z860100 Personal history of colon polyps, unspecified: Secondary | ICD-10-CM

## 2015-08-16 MED ORDER — SUPREP BOWEL PREP KIT 17.5-3.13-1.6 GM/177ML PO SOLN: 1.0000 | Freq: Once | ORAL | Status: DC

## 2015-08-16 NOTE — Progress Notes (Signed)
No allergies to eggs or soy or flu shot No diet/weight loss meds No home oxygen No past problems with anesthesia (PONV with general anesthesia)  Has email and internet

## 2015-08-30 ENCOUNTER — Encounter: Payer: Self-pay | Admitting: Gastroenterology

## 2015-08-30 ENCOUNTER — Ambulatory Visit (AMBULATORY_SURGERY_CENTER): Payer: Medicare Other | Admitting: Gastroenterology

## 2015-08-30 VITALS — BP 129/70 | HR 74 | Temp 97.1°F | Resp 16 | Ht 68.0 in | Wt 181.0 lb

## 2015-08-30 DIAGNOSIS — Z800 Family history of malignant neoplasm of digestive organs: Secondary | ICD-10-CM

## 2015-08-30 DIAGNOSIS — D125 Benign neoplasm of sigmoid colon: Secondary | ICD-10-CM | POA: Diagnosis not present

## 2015-08-30 DIAGNOSIS — D124 Benign neoplasm of descending colon: Secondary | ICD-10-CM | POA: Diagnosis not present

## 2015-08-30 DIAGNOSIS — I10 Essential (primary) hypertension: Secondary | ICD-10-CM | POA: Diagnosis not present

## 2015-08-30 DIAGNOSIS — Z86010 Personal history of colonic polyps: Secondary | ICD-10-CM

## 2015-08-30 DIAGNOSIS — J45909 Unspecified asthma, uncomplicated: Secondary | ICD-10-CM | POA: Diagnosis not present

## 2015-08-30 DIAGNOSIS — K219 Gastro-esophageal reflux disease without esophagitis: Secondary | ICD-10-CM | POA: Diagnosis not present

## 2015-08-30 DIAGNOSIS — D123 Benign neoplasm of transverse colon: Secondary | ICD-10-CM

## 2015-08-30 DIAGNOSIS — E669 Obesity, unspecified: Secondary | ICD-10-CM | POA: Diagnosis not present

## 2015-08-30 MED ORDER — AMOXICILLIN-POT CLAVULANATE 875-125 MG PO TABS: 1.0000 | ORAL_TABLET | Freq: Two times a day (BID) | ORAL | Status: DC

## 2015-08-30 MED ORDER — SODIUM CHLORIDE 0.9 % IV SOLN: 500.0000 mL | INTRAVENOUS | Status: DC

## 2015-08-30 NOTE — Op Note (Signed)
[place]  [address] [place], [id number]   COLONOSCOPY PROCEDURE REPORT  PATIENT: Dallanara, Mooring  MR#: [id number] BIRTHDATE: [birth year] , 35  yrs. old GENDER: female ENDOSCOPIST: Ladene Artist, MD, [facility] PROCEDURE DATE:  08/30/2015 PROCEDURE:   Colonoscopy, surveillance and Colonoscopy with snare polypectomy First Screening Colonoscopy - Avg.  risk and is 50 yrs.  old or older - No.  Prior Negative Screening - Now for repeat screening. N/A  History of Adenoma - Now for follow-up colonoscopy & has been > or = to 3 yrs.  Yes hx of adenoma.  Has been 3 or more years since last colonoscopy.  Polyps removed today? Yes ASA CLASS:   Class II INDICATIONS:Surveillance due to prior colonic neoplasia, PH Colon Adenoma, and FH Colon or Rectal Adenocarcinoma. MEDICATIONS: Monitored anesthesia care and Propofol 250 mg IV DESCRIPTION OF PROCEDURE:   After the risks benefits and alternatives of the procedure were thoroughly explained, informed consent was obtained.  The digital rectal exam revealed no abnormalities of the rectum.   The LB [id number]-H190 [id number]  endoscope was introduced through the anus and advanced to the cecum, which was identified by both the appendix and ileocecal valve. No adverse events experienced.   The quality of the prep was good.  (Suprep was used)  The instrument was then slowly withdrawn as the colon was fully examined. Estimated blood loss is zero unless otherwise noted in this procedure report.  COLON FINDINGS: Four sessile polyps measuring 6-7 mm in size were found in the sigmoid colon (1), descending colon (1), and transverse colon (2).  Polypectomies were performed with a cold snare.  The resection was complete, the polyp tissue was completely retrieved and sent to histology.   There was moderate diverticulosis noted in the sigmoid colon and descending colon with associated colonic spasm, luminal narrowing and muscular hypertrophy.    There was mild diverticulosis noted in the transverse colon.   The examination was otherwise normal. Retroflexed views revealed internal Grade I hemorrhoids. The time to cecum = 2.9 Withdrawal time = 11.9   The scope was withdrawn and the procedure completed. COMPLICATIONS: There were no immediate complications.  ENDOSCOPIC IMPRESSION: 1.   Four sessile polyps in the sigmoid, descending, and transverse colon; polypectomies performed with a cold snare 2.   Moderate diverticulosis in the sigmoid colon and descending colon 3.   Mild diverticulosis in the transverse colon 4.   Grade l internal hemorrhoids  RECOMMENDATIONS: 1.  Await pathology results 2.  High fiber diet with liberal fluid intake. 3.  Repeat colonoscopy in 3 years if 3-4 polyp(s) adenomatous; otherwise 5 years 4.  Augmentin 875/125  po bid, #20  eSigned:  Ladene Artist, MD, [facility] 08/30/2015 10:59 AM

## 2015-08-30 NOTE — Progress Notes (Signed)
Report to PACU, RN, vss, BBS= Clear.  

## 2015-08-30 NOTE — Patient Instructions (Signed)
YOU HAD AN ENDOSCOPIC PROCEDURE TODAY AT THE  ENDOSCOPY CENTER:   Refer to the procedure report that was given to you for any specific questions about what was found during the examination.  If the procedure report does not answer your questions, please call your gastroenterologist to clarify.  If you requested that your care partner not be given the details of your procedure findings, then the procedure report has been included in a sealed envelope for you to review at your convenience later.  YOU SHOULD EXPECT: Some feelings of bloating in the abdomen. Passage of more gas than usual.  Walking can help get rid of the air that was put into your GI tract during the procedure and reduce the bloating. If you had a lower endoscopy (such as a colonoscopy or flexible sigmoidoscopy) you may notice spotting of blood in your stool or on the toilet paper. If you underwent a bowel prep for your procedure, you may not have a normal bowel movement for a few days.  Please Note:  You might notice some irritation and congestion in your nose or some drainage.  This is from the oxygen used during your procedure.  There is no need for concern and it should clear up in a day or so.  SYMPTOMS TO REPORT IMMEDIATELY:   Following lower endoscopy (colonoscopy or flexible sigmoidoscopy):  Excessive amounts of blood in the stool  Significant tenderness or worsening of abdominal pains  Swelling of the abdomen that is new, acute  Fever of 100F or higher  For urgent or emergent issues, a gastroenterologist can be reached at any hour by calling [phone].   DIET: Your first meal following the procedure should be a small meal and then it is ok to progress to your normal diet. Heavy or fried foods are harder to digest and may make you feel nauseous or bloated.  Likewise, meals heavy in dairy and vegetables can increase bloating.  Drink plenty of fluids but you should avoid alcoholic beverages for 24  hours.  ACTIVITY:  You should plan to take it easy for the rest of today and you should NOT DRIVE or use heavy machinery until tomorrow (because of the sedation medicines used during the test).    FOLLOW UP: Our staff will call the number listed on your records the next business day following your procedure to check on you and address any questions or concerns that you may have regarding the information given to you following your procedure. If we do not reach you, we will leave a message.  However, if you are feeling well and you are not experiencing any problems, there is no need to return our call.  We will assume that you have returned to your regular daily activities without incident.  If any biopsies were taken you will be contacted by phone or by letter within the next 1-3 weeks.  Please call us at [phone] if you have not heard about the biopsies in 3 weeks.    SIGNATURES/CONFIDENTIALITY: You and/or your care partner have signed paperwork which will be entered into your electronic medical record.  These signatures attest to the fact that that the information above on your After Visit Summary has been reviewed and is understood.  Full responsibility of the confidentiality of this discharge information lies with you and/or your care-partner.   Polyp, diverticulosis, high fiber diet and hemorrhoid information given. 

## 2015-08-30 NOTE — Progress Notes (Signed)
Called to room to assist during endoscopic procedure.  Patient ID and intended procedure confirmed with present staff. Received instructions for my participation in the procedure from the performing physician.  

## 2015-08-31 ENCOUNTER — Telehealth: Payer: Self-pay | Admitting: *Deleted

## 2015-08-31 NOTE — Telephone Encounter (Signed)
  Follow up Call-  Call back number 08/30/2015  Post procedure Call Back phone  # [phone]  Permission to leave phone message Yes     Patient questions:  Do you have a fever, pain , or abdominal swelling? No. Pain Score  0 *  Have you tolerated food without any problems? Yes.    Have you been able to return to your normal activities? Yes.    Do you have any questions about your discharge instructions: Diet   No. Medications  No. Follow up visit  No.  Do you have questions or concerns about your Care? No.  Actions: * If pain score is 4 or above: No action needed, pain <4.

## 2015-09-11 ENCOUNTER — Encounter: Payer: Self-pay | Admitting: Gastroenterology

## 2015-11-02 ENCOUNTER — Encounter: Payer: Self-pay | Admitting: Internal Medicine

## 2015-11-03 MED ORDER — OMEPRAZOLE 40 MG PO CPDR: 40.0000 mg | DELAYED_RELEASE_CAPSULE | Freq: Every day | ORAL | Status: DC

## 2015-11-03 NOTE — Telephone Encounter (Signed)
erx sent to [facility] per pt request.

## 2015-11-28 DIAGNOSIS — M7062 Trochanteric bursitis, left hip: Secondary | ICD-10-CM | POA: Diagnosis not present

## 2015-11-28 DIAGNOSIS — M5136 Other intervertebral disc degeneration, lumbar region: Secondary | ICD-10-CM | POA: Diagnosis not present

## 2015-11-28 DIAGNOSIS — M7072 Other bursitis of hip, left hip: Secondary | ICD-10-CM | POA: Diagnosis not present

## 2015-11-28 DIAGNOSIS — G894 Chronic pain syndrome: Secondary | ICD-10-CM | POA: Diagnosis not present

## 2015-11-29 DIAGNOSIS — Z79891 Long term (current) use of opiate analgesic: Secondary | ICD-10-CM | POA: Diagnosis not present

## 2015-11-29 DIAGNOSIS — S73129D Ischiocapsular ligament sprain of unspecified hip, subsequent encounter: Secondary | ICD-10-CM | POA: Diagnosis not present

## 2015-11-29 DIAGNOSIS — M25552 Pain in left hip: Secondary | ICD-10-CM | POA: Diagnosis not present

## 2015-11-29 DIAGNOSIS — M791 Myalgia: Secondary | ICD-10-CM | POA: Diagnosis not present

## 2015-12-04 ENCOUNTER — Encounter: Payer: Self-pay | Admitting: Internal Medicine

## 2015-12-05 MED ORDER — ZOLPIDEM TARTRATE 10 MG PO TABS: 10.0000 mg | ORAL_TABLET | Freq: Every evening | ORAL | Status: DC | PRN

## 2015-12-05 NOTE — Telephone Encounter (Signed)
Called pt verified which pharmacy she want zolpidem sent to called script to [place] had to leave on VM...Sheila Hall

## 2015-12-08 ENCOUNTER — Encounter: Payer: Self-pay | Admitting: Internal Medicine

## 2015-12-08 NOTE — Telephone Encounter (Signed)
Called Zolpidem into # given in [place]...[name]

## 2016-02-16 ENCOUNTER — Telehealth: Payer: Self-pay | Admitting: Internal Medicine

## 2016-02-16 MED ORDER — ZOLPIDEM TARTRATE 10 MG PO TABS: 10.0000 mg | ORAL_TABLET | Freq: Every evening | ORAL | Status: DC | PRN

## 2016-02-16 NOTE — Telephone Encounter (Signed)
Needs Zolpidem Thx

## 2016-03-06 DIAGNOSIS — M7072 Other bursitis of hip, left hip: Secondary | ICD-10-CM | POA: Diagnosis not present

## 2016-03-06 DIAGNOSIS — M7071 Other bursitis of hip, right hip: Secondary | ICD-10-CM | POA: Diagnosis not present

## 2016-03-18 DIAGNOSIS — J029 Acute pharyngitis, unspecified: Secondary | ICD-10-CM | POA: Diagnosis not present

## 2016-03-22 DIAGNOSIS — M7072 Other bursitis of hip, left hip: Secondary | ICD-10-CM | POA: Diagnosis not present

## 2016-03-22 DIAGNOSIS — M25552 Pain in left hip: Secondary | ICD-10-CM | POA: Diagnosis not present

## 2016-03-22 DIAGNOSIS — M25551 Pain in right hip: Secondary | ICD-10-CM | POA: Diagnosis not present

## 2016-03-22 DIAGNOSIS — M7071 Other bursitis of hip, right hip: Secondary | ICD-10-CM | POA: Diagnosis not present

## 2016-03-25 DIAGNOSIS — M25551 Pain in right hip: Secondary | ICD-10-CM | POA: Diagnosis not present

## 2016-03-25 DIAGNOSIS — M7072 Other bursitis of hip, left hip: Secondary | ICD-10-CM | POA: Diagnosis not present

## 2016-03-25 DIAGNOSIS — M25552 Pain in left hip: Secondary | ICD-10-CM | POA: Diagnosis not present

## 2016-03-25 DIAGNOSIS — M7071 Other bursitis of hip, right hip: Secondary | ICD-10-CM | POA: Diagnosis not present

## 2016-03-27 DIAGNOSIS — M25551 Pain in right hip: Secondary | ICD-10-CM | POA: Diagnosis not present

## 2016-03-27 DIAGNOSIS — M7071 Other bursitis of hip, right hip: Secondary | ICD-10-CM | POA: Diagnosis not present

## 2016-03-27 DIAGNOSIS — M25552 Pain in left hip: Secondary | ICD-10-CM | POA: Diagnosis not present

## 2016-03-27 DIAGNOSIS — M7072 Other bursitis of hip, left hip: Secondary | ICD-10-CM | POA: Diagnosis not present

## 2016-03-29 DIAGNOSIS — M7071 Other bursitis of hip, right hip: Secondary | ICD-10-CM | POA: Diagnosis not present

## 2016-03-29 DIAGNOSIS — M25551 Pain in right hip: Secondary | ICD-10-CM | POA: Diagnosis not present

## 2016-03-29 DIAGNOSIS — M7072 Other bursitis of hip, left hip: Secondary | ICD-10-CM | POA: Diagnosis not present

## 2016-03-29 DIAGNOSIS — M25552 Pain in left hip: Secondary | ICD-10-CM | POA: Diagnosis not present

## 2016-04-01 DIAGNOSIS — M25552 Pain in left hip: Secondary | ICD-10-CM | POA: Diagnosis not present

## 2016-04-01 DIAGNOSIS — M7072 Other bursitis of hip, left hip: Secondary | ICD-10-CM | POA: Diagnosis not present

## 2016-04-01 DIAGNOSIS — M25551 Pain in right hip: Secondary | ICD-10-CM | POA: Diagnosis not present

## 2016-04-01 DIAGNOSIS — M7071 Other bursitis of hip, right hip: Secondary | ICD-10-CM | POA: Diagnosis not present

## 2016-04-03 DIAGNOSIS — M7071 Other bursitis of hip, right hip: Secondary | ICD-10-CM | POA: Diagnosis not present

## 2016-04-03 DIAGNOSIS — M25552 Pain in left hip: Secondary | ICD-10-CM | POA: Diagnosis not present

## 2016-04-03 DIAGNOSIS — M7072 Other bursitis of hip, left hip: Secondary | ICD-10-CM | POA: Diagnosis not present

## 2016-04-03 DIAGNOSIS — M25551 Pain in right hip: Secondary | ICD-10-CM | POA: Diagnosis not present

## 2016-04-09 ENCOUNTER — Other Ambulatory Visit (INDEPENDENT_AMBULATORY_CARE_PROVIDER_SITE_OTHER): Payer: Medicare Other

## 2016-04-09 ENCOUNTER — Ambulatory Visit (INDEPENDENT_AMBULATORY_CARE_PROVIDER_SITE_OTHER): Payer: Medicare Other | Admitting: Internal Medicine

## 2016-04-09 ENCOUNTER — Encounter: Payer: Self-pay | Admitting: Internal Medicine

## 2016-04-09 DIAGNOSIS — R590 Localized enlarged lymph nodes: Secondary | ICD-10-CM

## 2016-04-09 DIAGNOSIS — M7072 Other bursitis of hip, left hip: Secondary | ICD-10-CM | POA: Diagnosis not present

## 2016-04-09 DIAGNOSIS — Z1231 Encounter for screening mammogram for malignant neoplasm of breast: Secondary | ICD-10-CM | POA: Diagnosis not present

## 2016-04-09 LAB — CBC WITH DIFFERENTIAL/PLATELET
BASOS ABS: 0 10*3/uL (ref 0.0–0.1)
Basophils Relative: 0.3 % (ref 0.0–3.0)
EOS ABS: 0 10*3/uL (ref 0.0–0.7)
Eosinophils Relative: 0.4 % (ref 0.0–5.0)
HCT: 41 % (ref 36.0–46.0)
Hemoglobin: 13.8 g/dL (ref 12.0–15.0)
LYMPHS ABS: 2.3 10*3/uL (ref 0.7–4.0)
LYMPHS PCT: 22.1 % (ref 12.0–46.0)
MCHC: 33.7 g/dL (ref 30.0–36.0)
MCV: 95.3 fl (ref 78.0–100.0)
MONO ABS: 0.7 10*3/uL (ref 0.1–1.0)
Monocytes Relative: 6.3 % (ref 3.0–12.0)
NEUTROS ABS: 7.5 10*3/uL (ref 1.4–7.7)
NEUTROS PCT: 70.9 % (ref 43.0–77.0)
PLATELETS: 384 10*3/uL (ref 150.0–400.0)
RBC: 4.3 Mil/uL (ref 3.87–5.11)
RDW: 13.7 % (ref 11.5–15.5)
WBC: 10.6 10*3/uL — AB (ref 4.0–10.5)

## 2016-04-09 LAB — HEPATITIS C ANTIBODY: HCV AB: NEGATIVE

## 2016-04-09 LAB — HM MAMMOGRAPHY

## 2016-04-09 MED ORDER — TRAMADOL-ACETAMINOPHEN 37.5-325 MG PO TABS: 1.0000 | ORAL_TABLET | Freq: Four times a day (QID) | ORAL | 1 refills | Status: DC | PRN

## 2016-04-09 NOTE — Assessment & Plan Note (Signed)
L mandible jaw angle sensitive swollen gland 1.5x0.9 cm - post-viral? CBC ENT pending in [place]

## 2016-04-09 NOTE — Assessment & Plan Note (Signed)
Memory foam pad Ultracet prn  Potential benefits of a long term opioids use as well as potential risks (i.e. addiction risk, apnea etc) and complications (i.e. Somnolence, constipation and others) were explained to the patient and were aknowledged.

## 2016-04-09 NOTE — Progress Notes (Signed)
Subjective:  Patient ID: Sheila Hall, female    DOB: 1950-11-21  Age: 65 y.o. MRN: [id number]  CC: No chief complaint on file.   HPI Sheila Hall presents for ST and swollen glands, blisters on the L in 6/17. The pt was in [place] - went to [facility], had a Z pac.  Outpatient Medications Prior to Visit  Medication Sig Dispense Refill  . docusate sodium (COLACE) 100 MG capsule Take 1 capsule (100 mg total) by mouth at bedtime. 100 capsule 3  . estradiol (ESTRACE) 1 MG tablet Take 0.5 tablets (0.5 mg total) by mouth daily. 45 tablet 2  . omeprazole (PRILOSEC) 40 MG capsule Take 1 capsule (40 mg total) by mouth daily. 90 capsule 3  . traMADol (ULTRAM) 50 MG tablet Take 1 tablet (50 mg total) by mouth 2 (two) times daily as needed. 120 tablet 3  . zolpidem (AMBIEN) 10 MG tablet Take 1 tablet (10 mg total) by mouth at bedtime as needed for sleep. 90 tablet 1  . albuterol (PROVENTIL HFA;VENTOLIN HFA) 108 (90 BASE) MCG/ACT inhaler Inhale 2 puffs into the lungs every 4 (four) hours as needed for wheezing.    . furosemide (LASIX) 20 MG tablet Take 1-2 tablets (20-40 mg total) by mouth daily as needed for edema. (Patient not taking: Reported on 04/09/2016) 180 tablet 3  . gabapentin (NEURONTIN) 100 MG capsule Take 2 capsules (200 mg total) by mouth at bedtime. (Patient not taking: Reported on 04/09/2016) 60 capsule 3  . hydrOXYzine (ATARAX/VISTARIL) 50 MG tablet Take 1 tablet (50 mg total) by mouth at bedtime as needed for itching (or sleep). (Patient not taking: Reported on 04/09/2016) 30 tablet 3  . ondansetron (ZOFRAN) 4 MG tablet Take 1 tablet (4 mg total) by mouth every 8 (eight) hours as needed for nausea or vomiting. (Patient not taking: Reported on 04/09/2016) 20 tablet 0  . potassium chloride (KLOR-CON) 8 MEQ tablet Take 1 tablet (8 mEq total) by mouth daily as needed. Take with Lasix (Patient not taking: Reported on 04/09/2016) 90 tablet 3  . amoxicillin-clavulanate (AUGMENTIN) 875-125 MG tablet Take  1 tablet by mouth 2 (two) times daily. (Patient not taking: Reported on 04/09/2016) 20 tablet 0  . ergocalciferol (VITAMIN D2) 50000 UNITS capsule Take 1 capsule (50,000 Units total) by mouth every 30 (thirty) days. (Patient not taking: Reported on 04/09/2016) 3 capsule 4  . nitrofurantoin, macrocrystal-monohydrate, (MACROBID) 100 MG capsule Take 1 capsule (100 mg total) by mouth 2 (two) times daily. (Patient not taking: Reported on 04/09/2016) 10 capsule 0   Facility-Administered Medications Prior to Visit  Medication Dose Route Frequency Provider Last Rate Last Dose  . DOBUTamine (DOBUTREX) 1,000 mcg/mL in sodium chloride 0.9 % 150 mL infusion  40 mcg/kg Intravenous Continuous Evie Lacks Kalli Greenfield, MD   40 mcg/kg/min at 06/09/12 1530    ROS Review of Systems  Constitutional: Negative for activity change, appetite change, chills, fatigue and unexpected weight change.  HENT: Positive for sore throat. Negative for congestion, mouth sores and sinus pressure.   Eyes: Negative for visual disturbance.  Respiratory: Negative for cough and chest tightness.   Gastrointestinal: Negative for abdominal pain and nausea.  Genitourinary: Negative for difficulty urinating, frequency and vaginal pain.  Musculoskeletal: Positive for back pain. Negative for gait problem.  Skin: Negative for pallor and rash.  Neurological: Negative for dizziness, tremors, weakness, numbness and headaches.  Psychiatric/Behavioral: Negative for confusion and sleep disturbance.   L mandible jaw angle sensitive swollen gland 1.5x0.9  cm B hips are tender Objective:  BP 114/80   Pulse 70   Wt 167 lb (75.8 kg)   SpO2 96%   BMI 25.39 kg/m   BP Readings from Last 3 Encounters:  04/09/16 114/80  08/30/15 129/70  05/09/15 136/88    Wt Readings from Last 3 Encounters:  04/09/16 167 lb (75.8 kg)  08/30/15 181 lb (82.1 kg)  08/16/15 181 lb 12.8 oz (82.5 kg)    Physical Exam  Constitutional: She appears well-developed. No  distress.  HENT:  Head: Normocephalic.  Right Ear: External ear normal.  Left Ear: External ear normal.  Nose: Nose normal.  Mouth/Throat: Oropharynx is clear and moist.  Eyes: Conjunctivae are normal. Pupils are equal, round, and reactive to light. Right eye exhibits no discharge. Left eye exhibits no discharge.  Neck: Normal range of motion. Neck supple. No JVD present. No tracheal deviation present. No thyromegaly present.  Cardiovascular: Normal rate, regular rhythm and normal heart sounds.   Pulmonary/Chest: No stridor. No respiratory distress. She has no wheezes.  Abdominal: Soft. Bowel sounds are normal. She exhibits no distension and no mass. There is no tenderness. There is no rebound and no guarding.  Musculoskeletal: She exhibits tenderness. She exhibits no edema.  Lymphadenopathy:    She has no cervical adenopathy.  Neurological: She displays normal reflexes. No cranial nerve deficit. She exhibits normal muscle tone. Coordination normal.  Skin: No rash noted. No erythema.  Psychiatric: She has a normal mood and affect. Her behavior is normal. Judgment and thought content normal.    Lab Results  Component Value Date   WBC 9.1 05/09/2015   HGB 14.3 05/09/2015   HCT 42.7 05/09/2015   PLT 320.0 05/09/2015   GLUCOSE 88 05/09/2015   CHOL 217 (H) 05/09/2015   TRIG 153.0 (H) 05/09/2015   HDL 58.30 05/09/2015   LDLDIRECT 106.2 09/16/2012   LDLCALC 128 (H) 05/09/2015   ALT 17 05/09/2015   AST 19 05/09/2015   NA 139 05/09/2015   K 4.2 05/09/2015   CL 103 05/09/2015   CREATININE 0.75 05/09/2015   BUN 12 05/09/2015   CO2 26 05/09/2015   TSH 0.93 05/09/2015   HGBA1C 5.6 08/05/2007    Mr Pelvis W Wo Contrast  Result Date: 11/19/2014 CLINICAL DATA:  65 year old female with 3 week history of constant left buttock pain and left hip pain. History of diverticulitis. EXAM: MRI PELVIS WITHOUT AND WITH CONTRAST TECHNIQUE: Multiplanar multisequence MR imaging of the pelvis was  performed both before and after administration of intravenous contrast. CONTRAST:  46mL MULTIHANCE GADOBENATE DIMEGLUMINE 529 MG/ML IV SOLN COMPARISON:  CT of the abdomen and pelvis 11/16/2014. FINDINGS: Urinary Tract: Urinary bladder is normal in appearance. Distal ureters do not appear dilated. Bowel: Numerous colonic diverticulae in the sigmoid colon. No overt surrounding inflammatory changes to suggest an acute diverticulitis at this time in the visualized portions of the pelvis. Visualized portions of the small bowel do not appear dilated. Vascular/Lymphatic: No significant atherosclerotic disease or aneurysm identified in the abdominal or pelvic vasculature. No lymphadenopathy in the visualized pelvis. Reproductive: Status post hysterectomy.  Ovaries are atrophic. Other: No significant volume of ascites. Musculoskeletal: Refer to separate dictation for musculoskeletal MRI pelvis 11/18/2014. IMPRESSION: 1. No acute findings in the pelvis. 2. Sigmoid diverticulosis without evidence of acute diverticulitis at this time. 3. Additional incidental findings, as above. Electronically Signed   By: Vinnie Langton M.D.   On: 11/19/2014 12:01   Mr Hip Left W Wo Contrast  Result Date: 11/19/2014 CLINICAL DATA:  Left hip pain with bending. EXAM: MRI OF THE LEFT HIP WITHOUT AND WITH CONTRAST TECHNIQUE: Multiplanar, multisequence MR imaging was performed both before and after administration of intravenous contrast. CONTRAST:  35mL MULTIHANCE GADOBENATE DIMEGLUMINE 529 MG/ML IV SOLN COMPARISON:  None. FINDINGS: Bones: No fracture, dislocation or avascular necrosis. Mild degenerative changes of bilateral SI joints. No other focal marrow signal abnormality. Articular cartilage and labrum Articular cartilage: Partial thickness cartilage loss of the superior left acetabulum. Mild left femoral head chondromalacia. Labrum: Grossly intact, but evaluation is limited by lack of intraarticular fluid. Joint or bursal effusion Joint  effusion:  No significant joint effusion. Bursae:  No bursa formation. Muscles and tendons Flexors: Normal. Extensors: Normal. Abductors: Normal. Adductors: Normal. Rotators: Normal. Hamstrings: Mild edema within the ischial tuberosity at the hamstring insertion with tendinosis of the left hamstring origin. Other findings Miscellaneous: Diverticulosis of the sigmoid colon. No pelvic free fluid. IMPRESSION: 1. Mild tendinosis of the left hamstring origin with mild edema in the ischial tuberosity. 2. No hip fracture, dislocation or avascular necrosis. 3. Mild osteoarthritis of the left hip. 4. Diverticulosis of the sigmoid colon. Electronically Signed   By: Kathreen Devoid   On: 11/19/2014 08:30    Assessment & Plan:   There are no diagnoses linked to this encounter. I have discontinued Ms. Hammontree's ergocalciferol, nitrofurantoin (macrocrystal-monohydrate), and amoxicillin-clavulanate. I am also having her maintain her albuterol, docusate sodium, traMADol, furosemide, potassium chloride, hydrOXYzine, gabapentin, ondansetron, estradiol, omeprazole, and zolpidem.  No orders of the defined types were placed in this encounter.    Follow-up: No Follow-up on file.  Walker Kehr, MD

## 2016-04-09 NOTE — Progress Notes (Signed)
Pre visit review using our clinic review tool, if applicable. No additional management support is needed unless otherwise documented below in the visit note. 

## 2016-04-12 ENCOUNTER — Encounter: Payer: Self-pay | Admitting: Internal Medicine

## 2016-04-12 DIAGNOSIS — R928 Other abnormal and inconclusive findings on diagnostic imaging of breast: Secondary | ICD-10-CM | POA: Diagnosis not present

## 2016-04-16 DIAGNOSIS — M7071 Other bursitis of hip, right hip: Secondary | ICD-10-CM | POA: Diagnosis not present

## 2016-04-16 DIAGNOSIS — M25552 Pain in left hip: Secondary | ICD-10-CM | POA: Diagnosis not present

## 2016-04-16 DIAGNOSIS — M25551 Pain in right hip: Secondary | ICD-10-CM | POA: Diagnosis not present

## 2016-04-16 DIAGNOSIS — M7072 Other bursitis of hip, left hip: Secondary | ICD-10-CM | POA: Diagnosis not present

## 2016-04-18 ENCOUNTER — Encounter: Payer: Self-pay | Admitting: Internal Medicine

## 2016-04-18 DIAGNOSIS — M25552 Pain in left hip: Secondary | ICD-10-CM | POA: Diagnosis not present

## 2016-04-18 DIAGNOSIS — M7072 Other bursitis of hip, left hip: Secondary | ICD-10-CM | POA: Diagnosis not present

## 2016-04-18 DIAGNOSIS — M25551 Pain in right hip: Secondary | ICD-10-CM | POA: Diagnosis not present

## 2016-04-18 DIAGNOSIS — M7071 Other bursitis of hip, right hip: Secondary | ICD-10-CM | POA: Diagnosis not present

## 2016-04-22 DIAGNOSIS — M25551 Pain in right hip: Secondary | ICD-10-CM | POA: Diagnosis not present

## 2016-04-22 DIAGNOSIS — M7072 Other bursitis of hip, left hip: Secondary | ICD-10-CM | POA: Diagnosis not present

## 2016-04-22 DIAGNOSIS — M7071 Other bursitis of hip, right hip: Secondary | ICD-10-CM | POA: Diagnosis not present

## 2016-04-22 DIAGNOSIS — M25552 Pain in left hip: Secondary | ICD-10-CM | POA: Diagnosis not present

## 2016-04-24 DIAGNOSIS — M7071 Other bursitis of hip, right hip: Secondary | ICD-10-CM | POA: Diagnosis not present

## 2016-04-25 ENCOUNTER — Encounter: Payer: Self-pay | Admitting: Internal Medicine

## 2016-04-25 DIAGNOSIS — J312 Chronic pharyngitis: Secondary | ICD-10-CM | POA: Diagnosis not present

## 2016-04-25 DIAGNOSIS — K219 Gastro-esophageal reflux disease without esophagitis: Secondary | ICD-10-CM | POA: Diagnosis not present

## 2016-04-30 DIAGNOSIS — M7072 Other bursitis of hip, left hip: Secondary | ICD-10-CM | POA: Diagnosis not present

## 2016-04-30 DIAGNOSIS — M25551 Pain in right hip: Secondary | ICD-10-CM | POA: Diagnosis not present

## 2016-04-30 DIAGNOSIS — M25552 Pain in left hip: Secondary | ICD-10-CM | POA: Diagnosis not present

## 2016-04-30 DIAGNOSIS — M7071 Other bursitis of hip, right hip: Secondary | ICD-10-CM | POA: Diagnosis not present

## 2016-05-02 DIAGNOSIS — M25551 Pain in right hip: Secondary | ICD-10-CM | POA: Diagnosis not present

## 2016-05-02 DIAGNOSIS — M7071 Other bursitis of hip, right hip: Secondary | ICD-10-CM | POA: Diagnosis not present

## 2016-05-06 ENCOUNTER — Encounter: Payer: Self-pay | Admitting: Internal Medicine

## 2016-05-07 DIAGNOSIS — M25552 Pain in left hip: Secondary | ICD-10-CM | POA: Diagnosis not present

## 2016-05-07 DIAGNOSIS — M7071 Other bursitis of hip, right hip: Secondary | ICD-10-CM | POA: Diagnosis not present

## 2016-05-07 DIAGNOSIS — M7072 Other bursitis of hip, left hip: Secondary | ICD-10-CM | POA: Diagnosis not present

## 2016-05-07 DIAGNOSIS — M25551 Pain in right hip: Secondary | ICD-10-CM | POA: Diagnosis not present

## 2016-05-08 DIAGNOSIS — M7071 Other bursitis of hip, right hip: Secondary | ICD-10-CM | POA: Diagnosis not present

## 2016-05-22 DIAGNOSIS — M791 Myalgia: Secondary | ICD-10-CM | POA: Diagnosis not present

## 2016-05-28 ENCOUNTER — Telehealth: Payer: Self-pay | Admitting: *Deleted

## 2016-05-28 NOTE — Telephone Encounter (Signed)
Left msg on triage wanting to verify ambien rx. Called pharmacy back spoke w/Anna she states pt is wanting to get her ambien transferred down there just needing to verify script. Verified script from 6/9...Sheila Hall

## 2016-05-29 DIAGNOSIS — M791 Myalgia: Secondary | ICD-10-CM | POA: Diagnosis not present

## 2016-05-29 DIAGNOSIS — M542 Cervicalgia: Secondary | ICD-10-CM | POA: Diagnosis not present

## 2016-05-29 DIAGNOSIS — Z79891 Long term (current) use of opiate analgesic: Secondary | ICD-10-CM | POA: Diagnosis not present

## 2016-06-04 DIAGNOSIS — M7061 Trochanteric bursitis, right hip: Secondary | ICD-10-CM | POA: Diagnosis not present

## 2016-06-04 DIAGNOSIS — M25551 Pain in right hip: Secondary | ICD-10-CM | POA: Diagnosis not present

## 2016-06-05 DIAGNOSIS — K219 Gastro-esophageal reflux disease without esophagitis: Secondary | ICD-10-CM | POA: Diagnosis not present

## 2016-06-05 DIAGNOSIS — J312 Chronic pharyngitis: Secondary | ICD-10-CM | POA: Diagnosis not present

## 2016-06-20 DIAGNOSIS — Z23 Encounter for immunization: Secondary | ICD-10-CM | POA: Diagnosis not present

## 2016-07-01 DIAGNOSIS — M542 Cervicalgia: Secondary | ICD-10-CM | POA: Diagnosis not present

## 2016-07-01 DIAGNOSIS — M791 Myalgia: Secondary | ICD-10-CM | POA: Diagnosis not present

## 2016-07-01 DIAGNOSIS — G5701 Lesion of sciatic nerve, right lower limb: Secondary | ICD-10-CM | POA: Diagnosis not present

## 2016-07-01 DIAGNOSIS — Z79891 Long term (current) use of opiate analgesic: Secondary | ICD-10-CM | POA: Diagnosis not present

## 2016-07-08 DIAGNOSIS — M791 Myalgia: Secondary | ICD-10-CM | POA: Diagnosis not present

## 2016-07-08 DIAGNOSIS — G5701 Lesion of sciatic nerve, right lower limb: Secondary | ICD-10-CM | POA: Diagnosis not present

## 2016-07-18 ENCOUNTER — Encounter: Payer: Self-pay | Admitting: Internal Medicine

## 2016-07-18 DIAGNOSIS — M810 Age-related osteoporosis without current pathological fracture: Secondary | ICD-10-CM

## 2016-07-19 NOTE — Telephone Encounter (Signed)
MD is ok pt to have bone density on the same day of CPX. Place order will schedule. Dexa made 11/20 @2 :30../lmb

## 2016-07-29 ENCOUNTER — Ambulatory Visit (INDEPENDENT_AMBULATORY_CARE_PROVIDER_SITE_OTHER): Payer: Medicare Other | Admitting: Internal Medicine

## 2016-07-29 ENCOUNTER — Other Ambulatory Visit (INDEPENDENT_AMBULATORY_CARE_PROVIDER_SITE_OTHER): Payer: Medicare Other

## 2016-07-29 ENCOUNTER — Encounter: Payer: Self-pay | Admitting: Internal Medicine

## 2016-07-29 ENCOUNTER — Ambulatory Visit (INDEPENDENT_AMBULATORY_CARE_PROVIDER_SITE_OTHER)
Admission: RE | Admit: 2016-07-29 | Discharge: 2016-07-29 | Disposition: A | Discharge status: Home / Self Care | Payer: Medicare Other | Source: Ambulatory Visit | Attending: Internal Medicine | Admitting: Internal Medicine

## 2016-07-29 VITALS — BP 140/74 | HR 79 | Ht 68.0 in | Wt 171.0 lb

## 2016-07-29 DIAGNOSIS — Z0000 Encounter for general adult medical examination without abnormal findings: Secondary | ICD-10-CM

## 2016-07-29 DIAGNOSIS — M810 Age-related osteoporosis without current pathological fracture: Secondary | ICD-10-CM | POA: Diagnosis not present

## 2016-07-29 DIAGNOSIS — M25551 Pain in right hip: Secondary | ICD-10-CM

## 2016-07-29 DIAGNOSIS — M25552 Pain in left hip: Secondary | ICD-10-CM

## 2016-07-29 DIAGNOSIS — E785 Hyperlipidemia, unspecified: Secondary | ICD-10-CM | POA: Diagnosis not present

## 2016-07-29 DIAGNOSIS — D72829 Elevated white blood cell count, unspecified: Secondary | ICD-10-CM | POA: Diagnosis not present

## 2016-07-29 LAB — CBC WITH DIFFERENTIAL/PLATELET
BASOS ABS: 0 10*3/uL (ref 0.0–0.1)
BASOS PCT: 0.4 % (ref 0.0–3.0)
EOS PCT: 0.5 % (ref 0.0–5.0)
Eosinophils Absolute: 0 10*3/uL (ref 0.0–0.7)
HEMATOCRIT: 41.1 % (ref 36.0–46.0)
Hemoglobin: 14 g/dL (ref 12.0–15.0)
LYMPHS ABS: 2.5 10*3/uL (ref 0.7–4.0)
LYMPHS PCT: 24.5 % (ref 12.0–46.0)
MCHC: 34.1 g/dL (ref 30.0–36.0)
MCV: 94.8 fl (ref 78.0–100.0)
MONOS PCT: 6.6 % (ref 3.0–12.0)
Monocytes Absolute: 0.7 10*3/uL (ref 0.1–1.0)
NEUTROS ABS: 6.9 10*3/uL (ref 1.4–7.7)
NEUTROS PCT: 68 % (ref 43.0–77.0)
PLATELETS: 373 10*3/uL (ref 150.0–400.0)
RBC: 4.33 Mil/uL (ref 3.87–5.11)
RDW: 12.6 % (ref 11.5–15.5)
WBC: 10.1 10*3/uL (ref 4.0–10.5)

## 2016-07-29 LAB — HEPATIC FUNCTION PANEL
ALT: 14 U/L (ref 0–35)
AST: 13 U/L (ref 0–37)
Albumin: 4.2 g/dL (ref 3.5–5.2)
Alkaline Phosphatase: 49 U/L (ref 39–117)
BILIRUBIN TOTAL: 0.4 mg/dL (ref 0.2–1.2)
Bilirubin, Direct: 0.1 mg/dL (ref 0.0–0.3)
TOTAL PROTEIN: 6.9 g/dL (ref 6.0–8.3)

## 2016-07-29 LAB — URINALYSIS
Bilirubin Urine: NEGATIVE
Hgb urine dipstick: NEGATIVE
KETONES UR: NEGATIVE
Leukocytes, UA: NEGATIVE
Nitrite: NEGATIVE
PH: 5.5 (ref 5.0–8.0)
SPECIFIC GRAVITY, URINE: 1.01 (ref 1.000–1.030)
Total Protein, Urine: NEGATIVE
URINE GLUCOSE: NEGATIVE
Urobilinogen, UA: 0.2 (ref 0.0–1.0)

## 2016-07-29 LAB — BASIC METABOLIC PANEL
BUN: 15 mg/dL (ref 6–23)
CALCIUM: 9.4 mg/dL (ref 8.4–10.5)
CHLORIDE: 103 meq/L (ref 96–112)
CO2: 28 meq/L (ref 19–32)
Creatinine, Ser: 0.94 mg/dL (ref 0.40–1.20)
GFR: 63.14 mL/min (ref >60.00)
GLUCOSE: 97 mg/dL (ref 70–99)
Potassium: 4.1 mEq/L (ref 3.5–5.1)
SODIUM: 139 meq/L (ref 135–145)

## 2016-07-29 LAB — TSH: TSH: 1 u[IU]/mL (ref 0.35–4.50)

## 2016-07-29 MED ORDER — ZOLPIDEM TARTRATE 10 MG PO TABS: 10.0000 mg | ORAL_TABLET | Freq: Every evening | ORAL | 1 refills | Status: DC | PRN

## 2016-07-29 NOTE — Assessment & Plan Note (Signed)
resolved 

## 2016-07-29 NOTE — Progress Notes (Signed)
Subjective:  Patient ID: Sheila Hall, female    DOB: June 14, 1951  Age: 65 y.o. MRN: [id number]  CC: No chief complaint on file.   HPI [name] presents for a well exam. C/o R hip pain: pt saw an orthopedic surgeon and had an MRI. R hip is better after a piriformis injection L hip got better with Rx   Outpatient Medications Prior to Visit  Medication Sig Dispense Refill  . docusate sodium (COLACE) 100 MG capsule Take 1 capsule (100 mg total) by mouth at bedtime. 100 capsule 3  . estradiol (ESTRACE) 1 MG tablet Take 0.5 tablets (0.5 mg total) by mouth daily. 45 tablet 2  . omeprazole (PRILOSEC) 40 MG capsule Take 1 capsule (40 mg total) by mouth daily. 90 capsule 3  . traMADol-acetaminophen (ULTRACET) 37.5-325 MG tablet Take 1 tablet by mouth every 6 (six) hours as needed for severe pain. 120 tablet 1  . zolpidem (AMBIEN) 10 MG tablet Take 1 tablet (10 mg total) by mouth at bedtime as needed for sleep. 90 tablet 1  . albuterol (PROVENTIL HFA;VENTOLIN HFA) 108 (90 BASE) MCG/ACT inhaler Inhale 2 puffs into the lungs every 4 (four) hours as needed for wheezing.    . furosemide (LASIX) 20 MG tablet Take 1-2 tablets (20-40 mg total) by mouth daily as needed for edema. (Patient not taking: Reported on 07/29/2016) 180 tablet 3  . gabapentin (NEURONTIN) 100 MG capsule Take 2 capsules (200 mg total) by mouth at bedtime. (Patient not taking: Reported on 07/29/2016) 60 capsule 3  . hydrOXYzine (ATARAX/VISTARIL) 50 MG tablet Take 1 tablet (50 mg total) by mouth at bedtime as needed for itching (or sleep). (Patient not taking: Reported on 07/29/2016) 30 tablet 3  . ondansetron (ZOFRAN) 4 MG tablet Take 1 tablet (4 mg total) by mouth every 8 (eight) hours as needed for nausea or vomiting. (Patient not taking: Reported on 07/29/2016) 20 tablet 0  . potassium chloride (KLOR-CON) 8 MEQ tablet Take 1 tablet (8 mEq total) by mouth daily as needed. Take with Lasix (Patient not taking: Reported on  07/29/2016) 90 tablet 3   Facility-Administered Medications Prior to Visit  Medication Dose Route Frequency Provider Last Rate Last Dose  . DOBUTamine (DOBUTREX) 1,000 mcg/mL in sodium chloride 0.9 % 150 mL infusion  40 mcg/kg Intravenous Continuous Evie Lacks Cathyann Kilfoyle, MD   40 mcg/kg/min at 06/09/12 1530    ROS Review of Systems  Constitutional: Negative for activity change, appetite change, chills, fatigue and unexpected weight change.  HENT: Negative for congestion, mouth sores and sinus pressure.   Eyes: Negative for visual disturbance.  Respiratory: Negative for cough and chest tightness.   Gastrointestinal: Negative for abdominal pain and nausea.  Genitourinary: Negative for difficulty urinating, frequency and vaginal pain.  Musculoskeletal: Positive for arthralgias and gait problem. Negative for back pain.  Skin: Negative for pallor and rash.  Neurological: Negative for dizziness, tremors, weakness, numbness and headaches.  Psychiatric/Behavioral: Negative for confusion and sleep disturbance.    Objective:  BP 140/74   Pulse 79   Ht 5\' 8"  (1.727 m)   Wt 171 lb (77.6 kg)   SpO2 98%   BMI 26.00 kg/m   BP Readings from Last 3 Encounters:  07/29/16 140/74  04/09/16 114/80  08/30/15 129/70    Wt Readings from Last 3 Encounters:  07/29/16 171 lb (77.6 kg)  04/09/16 167 lb (75.8 kg)  08/30/15 181 lb (82.1 kg)    Physical Exam  Constitutional: She appears  well-developed. No distress.  HENT:  Head: Normocephalic.  Right Ear: External ear normal.  Left Ear: External ear normal.  Nose: Nose normal.  Mouth/Throat: Oropharynx is clear and moist.  Eyes: Conjunctivae are normal. Pupils are equal, round, and reactive to light. Right eye exhibits no discharge. Left eye exhibits no discharge.  Neck: Normal range of motion. Neck supple. No JVD present. No tracheal deviation present. No thyromegaly present.  Cardiovascular: Normal rate, regular rhythm and normal heart sounds.     Pulmonary/Chest: No stridor. No respiratory distress. She has no wheezes.  Abdominal: Soft. Bowel sounds are normal. She exhibits no distension and no mass. There is no tenderness. There is no rebound and no guarding.  Musculoskeletal: She exhibits tenderness. She exhibits no edema.  Lymphadenopathy:    She has no cervical adenopathy.  Neurological: She displays normal reflexes. No cranial nerve deficit. She exhibits normal muscle tone. Coordination normal.  Skin: No rash noted. No erythema.  Psychiatric: She has a normal mood and affect. Her behavior is normal. Judgment and thought content normal.  R hip is tender laterally Str leg is (-) B Pt declined vag PAP  Lab Results  Component Value Date   WBC 10.6 (H) 04/09/2016   HGB 13.8 04/09/2016   HCT 41.0 04/09/2016   PLT 384.0 04/09/2016   GLUCOSE 88 05/09/2015   CHOL 217 (H) 05/09/2015   TRIG 153.0 (H) 05/09/2015   HDL 58.30 05/09/2015   LDLDIRECT 106.2 09/16/2012   LDLCALC 128 (H) 05/09/2015   ALT 17 05/09/2015   AST 19 05/09/2015   NA 139 05/09/2015   K 4.2 05/09/2015   CL 103 05/09/2015   CREATININE 0.75 05/09/2015   BUN 12 05/09/2015   CO2 26 05/09/2015   TSH 0.93 05/09/2015   HGBA1C 5.6 08/05/2007    Mr Pelvis W Wo Contrast  Result Date: 11/19/2014 CLINICAL DATA:  65 year old female with 3 week history of constant left buttock pain and left hip pain. History of diverticulitis. EXAM: MRI PELVIS WITHOUT AND WITH CONTRAST TECHNIQUE: Multiplanar multisequence MR imaging of the pelvis was performed both before and after administration of intravenous contrast. CONTRAST:  90mL MULTIHANCE GADOBENATE DIMEGLUMINE 529 MG/ML IV SOLN COMPARISON:  CT of the abdomen and pelvis 11/16/2014. FINDINGS: Urinary Tract: Urinary bladder is normal in appearance. Distal ureters do not appear dilated. Bowel: Numerous colonic diverticulae in the sigmoid colon. No overt surrounding inflammatory changes to suggest an acute diverticulitis at this time  in the visualized portions of the pelvis. Visualized portions of the small bowel do not appear dilated. Vascular/Lymphatic: No significant atherosclerotic disease or aneurysm identified in the abdominal or pelvic vasculature. No lymphadenopathy in the visualized pelvis. Reproductive: Status post hysterectomy.  Ovaries are atrophic. Other: No significant volume of ascites. Musculoskeletal: Refer to separate dictation for musculoskeletal MRI pelvis 11/18/2014. IMPRESSION: 1. No acute findings in the pelvis. 2. Sigmoid diverticulosis without evidence of acute diverticulitis at this time. 3. Additional incidental findings, as above. Electronically Signed   By: Vinnie Langton M.D.   On: 11/19/2014 12:01   Mr Hip Left W Wo Contrast  Result Date: 11/19/2014 CLINICAL DATA:  Left hip pain with bending. EXAM: MRI OF THE LEFT HIP WITHOUT AND WITH CONTRAST TECHNIQUE: Multiplanar, multisequence MR imaging was performed both before and after administration of intravenous contrast. CONTRAST:  58mL MULTIHANCE GADOBENATE DIMEGLUMINE 529 MG/ML IV SOLN COMPARISON:  None. FINDINGS: Bones: No fracture, dislocation or avascular necrosis. Mild degenerative changes of bilateral SI joints. No other focal marrow signal  abnormality. Articular cartilage and labrum Articular cartilage: Partial thickness cartilage loss of the superior left acetabulum. Mild left femoral head chondromalacia. Labrum: Grossly intact, but evaluation is limited by lack of intraarticular fluid. Joint or bursal effusion Joint effusion:  No significant joint effusion. Bursae:  No bursa formation. Muscles and tendons Flexors: Normal. Extensors: Normal. Abductors: Normal. Adductors: Normal. Rotators: Normal. Hamstrings: Mild edema within the ischial tuberosity at the hamstring insertion with tendinosis of the left hamstring origin. Other findings Miscellaneous: Diverticulosis of the sigmoid colon. No pelvic free fluid. IMPRESSION: 1. Mild tendinosis of the left  hamstring origin with mild edema in the ischial tuberosity. 2. No hip fracture, dislocation or avascular necrosis. 3. Mild osteoarthritis of the left hip. 4. Diverticulosis of the sigmoid colon. Electronically Signed   By: Kathreen Devoid   On: 11/19/2014 08:30    Assessment & Plan:   There are no diagnoses linked to this encounter. I am having Ms. Licht maintain her albuterol, docusate sodium, furosemide, potassium chloride, hydrOXYzine, gabapentin, ondansetron, estradiol, omeprazole, zolpidem, traMADol-acetaminophen, and Magnesium.  Meds ordered this encounter  Medications  . Magnesium 200 MG TABS    Sig: Take 1 each by mouth daily.      Follow-up: No Follow-up on file.  Walker Kehr, MD

## 2016-07-29 NOTE — Assessment & Plan Note (Signed)
CBC

## 2016-07-29 NOTE — Assessment & Plan Note (Addendum)
Had an MRI, injections 2017 R --- MRI OK

## 2016-07-29 NOTE — Assessment & Plan Note (Signed)

## 2016-07-29 NOTE — Patient Instructions (Signed)

## 2016-07-29 NOTE — Progress Notes (Signed)
Pre visit review using our clinic review tool, if applicable. No additional management support is needed unless otherwise documented below in the visit note. 

## 2016-09-05 NOTE — Progress Notes (Signed)
[facility] [address] [place], [place] [address] Phone: [phone] Subjective:    I'm seeing this patient by the request  of:  Walker Kehr, MD   CC: right Hip pain  [id number]  Sheila Hall is a 65 y.o. female coming in with complaint of right hip pain. Patient was seen by me previously and did have an ischial bursitis on the left side. Did respond very well to a PRP injection previously. Today the patient is having more of a right-sided pain. States that she is having more discomfort on the lateral aspect of it. Denies any radiation down the leg but sometimes can have tightness of the hamstring. Concern that both hips is also been hurting. Patient states sometimes can wake her up at night. Sometimes has of throbbing aching pain in both hips bilaterally posterior and laterally.    patient's imaging shows the patient did have an MRI of the right hip in August 2017. Report was visualized by me showing no significant bony abnormality.    Past Medical History:  Diagnosis Date  . ASTHMA 08/12/2007   seasonal  . DISORDER, CERVICAL DISC W/MYELOPATHY 05/19/2007  . Diverticulosis 08/14/2010  . Edema 03/10/2009  . GERD 04/13/2007  . Headache(784.0) 02/15/2010  . History of colon polyps 08/14/2010  . HYPERLIPIDEMIA 04/13/2007  . OSTEOPENIA 04/13/2007  . OSTEOPOROSIS 05/23/2007  . SYNCOPE 02/15/2010   Past Surgical History:  Procedure Laterality Date  . ABDOMINAL HYSTERECTOMY    . APPENDECTOMY    . bladdder tacking    . cervical laminectomy    . CHOLECYSTECTOMY    . LUMBAR DISC SURGERY    . OTHER SURGICAL HISTORY     left lower arm skin cryo - benign  . TONSILLECTOMY    . TUBAL LIGATION     Social History   Social History  . Marital status: Married    Spouse name: N/A  . Number of children: 2  . 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 Smoking status: Never Smoker  . Smokeless  tobacco: Never Used  . Alcohol use No  . Drug use: No  . Sexual activity: Yes   Other Topics Concern  . Not on file   Social History Narrative  . No narrative on file   Allergies  Allergen Reactions  . Codeine Sulfate     REACTION: nausea   Family History  Problem Relation Age of Onset  . Colon cancer Father 104  . Cancer Father 14    bladder, prostate  . Diabetes Brother   . Cancer Mother 25    pancreatic ca  . Heart disease Sister 86    CAD  . Breast cancer Maternal Aunt   . Colon polyps      sistersx2, brothersx2  . Kidney disease Brother     Past medical history, social, surgical and family history all reviewed in electronic medical record.  No pertanent information unless stated regarding to the chief complaint.   Review of Systems:Review of systems updated and as accurate as of 09/05/16  No headache, visual changes, nausea, vomiting, diarrhea, constipation, dizziness, abdominal pain, skin rash, fevers, chills, night sweats, weight loss, swollen lymph nodes, body aches, joint swelling, muscle aches, chest pain, shortness of breath, mood changes.   Objective  There were no vitals taken for this visit. Systems examined below as of 09/05/16   General: No apparent distress alert  and oriented x3 mood and affect normal, dressed appropriately.  HEENT: Pupils equal, extraocular movements intact  Respiratory: Patient's speak in full sentences and does not appear short of breath  Cardiovascular: No lower extremity edema, non tender, no erythema  Skin: Warm dry intact with no signs of infection or rash on extremities or on axial skeleton.  Abdomen: Soft nontender  Neuro: Cranial nerves II through XII are intact, neurovascularly intact in all extremities with 2+ DTRs and 2+ pulses.  Lymph: No lymphadenopathy of posterior or anterior cervical chain or axillae bilaterally.  Gait normal with good balance and coordination.  MSK:  Non tender with full range of motion and good  stability and symmetric strength and tone of shoulders, elbows, wrist,  knee and ankles bilaterally.  Hip: right  ROM IR: 15 Deg, ER: 25 Deg, Flexion: 120 Deg, Extension: 100 Deg, Abduction: 45 Deg, Adduction: 25 Deg Strength IR: 5/5, ER: 5/5, Flexion: 5/5, Extension: 5/5, Abduction: 5/5, Adduction: 5/5 Pelvic alignment unremarkable to inspection and palpation. Standing hip rotation and gait without trendelenburg sign / unsteadiness. Greater trochanter With moderate to severe tenderness Mild pain over the piriformis and mild pain over the insertion of the hamstring. Positive Faber negative pain with internal rotation No SI joint tenderness and normal minimal SI movement.  MSK US performed of: Right This study was ordered, performed, and interpreted by Charlann Boxer D.O.  Hip: Trochanteric bursa with significant hypoechoic changes and swelling Acetabular labrum visualized and without tears, displacement, or effusion in joint. Femoral neck appears unremarkable without increased power doppler signal along Cortex.  IMPRESSION:  Greater trochanter bursitis   Procedure: Real-time Ultrasound Guided Injection of right greater trochanteric bursitis secondary to patient's body habitus Device: GE Logiq E  Ultrasound guided injection is preferred based studies that show increased duration, increased effect, greater accuracy, decreased procedural pain, increased response rate, and decreased cost with ultrasound guided versus blind injection.  Verbal informed consent obtained.  Time-out conducted.  Noted no overlying erythema, induration, or other signs of local infection.  Skin prepped in a sterile fashion.  Local anesthesia: Topical Ethyl chloride.  With sterile technique and under real time ultrasound guidance:  Greater trochanteric area was visualized and patient's bursa was noted. A 22-gauge 3 inch needle was inserted and 4 cc of 0.5% Marcaine and 1 cc of Kenalog 40 mg/dL was injected. Pictures  taken Completed without difficulty  Pain immediately resolved suggesting accurate placement of the medication.  Advised to call if fevers/chills, erythema, induration, drainage, or persistent bleeding.  Images permanently stored and available for review in the ultrasound unit.  Impression: Technically successful ultrasound guided injection.     Impression and Recommendations:     This case required medical decision making of moderate complexity.      Note: This dictation was prepared with Dragon dictation along with smaller phrase technology. Any transcriptional errors that result from this process are unintentional.

## 2016-09-06 ENCOUNTER — Ambulatory Visit: Payer: Self-pay

## 2016-09-06 ENCOUNTER — Ambulatory Visit (INDEPENDENT_AMBULATORY_CARE_PROVIDER_SITE_OTHER): Payer: Medicare Other | Admitting: Family Medicine

## 2016-09-06 ENCOUNTER — Encounter: Payer: Self-pay | Admitting: Family Medicine

## 2016-09-06 ENCOUNTER — Ambulatory Visit (INDEPENDENT_AMBULATORY_CARE_PROVIDER_SITE_OTHER)
Admission: RE | Admit: 2016-09-06 | Discharge: 2016-09-06 | Disposition: A | Discharge status: Home / Self Care | Payer: Medicare Other | Source: Ambulatory Visit | Attending: Family Medicine | Admitting: Family Medicine

## 2016-09-06 VITALS — BP 136/82 | HR 73 | Ht 68.0 in | Wt 170.0 lb

## 2016-09-06 DIAGNOSIS — M25551 Pain in right hip: Secondary | ICD-10-CM

## 2016-09-06 DIAGNOSIS — M7061 Trochanteric bursitis, right hip: Secondary | ICD-10-CM | POA: Diagnosis not present

## 2016-09-06 DIAGNOSIS — M419 Scoliosis, unspecified: Secondary | ICD-10-CM | POA: Diagnosis not present

## 2016-09-06 MED ORDER — GABAPENTIN 100 MG PO CAPS: 200.0000 mg | ORAL_CAPSULE | Freq: Every day | ORAL | 3 refills | Status: DC

## 2016-09-06 MED ORDER — PREDNISONE 50 MG PO TABS: 50.0000 mg | ORAL_TABLET | Freq: Every day | ORAL | 0 refills | Status: DC

## 2016-09-06 NOTE — Assessment & Plan Note (Signed)
Patient given injection today. Past mental history though is significant for lumbar surgery and I'm concern lumbar radiculopathy is plain a role. Patient has x-rays pending. We discussed icing regimen, home exercises, which activities doing which ones to avoid. Patient will continue to do conservative therapy and follow-up with me again in 3 months

## 2016-09-06 NOTE — Patient Instructions (Addendum)
Good to see you.  Ice 20 minutes 2 times daily. Usually after activity and before bed. Exercises 3 times a week.  Gabapentin 200mg  at night Xray of your back downstairs today  If not a lot better take prednisone daily for 5 days.  I would like to see you again in 1-2 months and if not better we can discuss PRP or look into your back more Happy New Year!

## 2016-12-12 NOTE — Progress Notes (Signed)
[facility] [address] [place], [place] [address] Phone: [phone] Subjective:    I'm seeing this patient by the request  of:  Walker Kehr, MD   CC: right Hip pain  Sheila Hall is a 66 y.o. female coming in with complaint of right hip pain. Patient was seen by me previously and did have an ischial bursitis on the left side. Did respond very well to a PRP injection previously. Patient has responded fairly well and in December did have a greater trochanteric injection. Patient states Side pain was improved. Patient unfortunately is having more of the ischial bursitis. Seems to be bilateral. Has had difficult with this. States that it hurts her when she is sitting down or even when she tries to walk.    patient's imaging shows the patient did have an MRI of the right hip in August 2017. Report was visualized by me showing no significant bony abnormality. Patient did have x-rays taken 09/06/2016. These show some very mild degenerative joint disease and osteophyte formation of the lower spine but otherwise fairly unremarkable.   Past Medical History:  Diagnosis Date  . ASTHMA 08/12/2007   seasonal  . DISORDER, CERVICAL DISC W/MYELOPATHY 05/19/2007  . Diverticulosis 08/14/2010  . Edema 03/10/2009  . GERD 04/13/2007  . Headache(784.0) 02/15/2010  . History of colon polyps 08/14/2010  . HYPERLIPIDEMIA 04/13/2007  . OSTEOPENIA 04/13/2007  . OSTEOPOROSIS 05/23/2007  . SYNCOPE 02/15/2010   Past Surgical History:  Procedure Laterality Date  . ABDOMINAL HYSTERECTOMY    . APPENDECTOMY    . bladdder tacking    . cervical laminectomy    . CHOLECYSTECTOMY    . LUMBAR DISC SURGERY    . OTHER SURGICAL HISTORY     left lower arm skin cryo - benign  . TONSILLECTOMY    . TUBAL LIGATION     Social History   Social History  . Marital status: Married    Spouse name: N/A  . Number of children: 2  . 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 Smoking status: Never Smoker  . Smokeless tobacco: Never Used  . Alcohol use No  . Drug use: No  . Sexual activity: Yes   Other Topics Concern  . None   Social History Narrative  . None   Allergies  Allergen Reactions  . Codeine Sulfate     REACTION: nausea   Family History  Problem Relation Age of Onset  . Colon cancer Father 80  . Cancer Father 84    bladder, prostate  . Diabetes Brother   . Cancer Mother 40    pancreatic ca  . Heart disease Sister 21    CAD  . Breast cancer Maternal Aunt   . Colon polyps      sistersx2, brothersx2  . Kidney disease Brother     Past medical history, social, surgical and family history all reviewed in electronic medical record.  No pertanent information unless stated regarding to the chief complaint.   Review of Systems: No headache, visual changes, nausea, vomiting, diarrhea, constipation, dizziness, abdominal pain, skin rash, fevers, chills, night sweats, weight loss, swollen lymph nodes, body aches, joint swelling, muscle aches, chest pain, shortness of breath, mood changes.    Objective  Blood pressure 134/90, pulse 61, height 5\' 8"  (1.727 m), weight 171 lb 8 oz (77.8 kg), SpO2 98 %.  Systems examined below as of 12/13/16 General: NAD A&O x3 mood, affect normal  HEENT: Pupils equal, extraocular movements intact no nystagmus Respiratory: not short of breath at rest or with speaking Cardiovascular: No lower extremity edema, non tender Skin: Warm dry intact with no signs of infection or rash on extremities or on axial skeleton. Abdomen: Soft nontender, no masses Neuro: Cranial nerves  intact, neurovascularly intact in all extremities with 2+ DTRs and 2+ pulses. Lymph: No lymphadenopathy appreciated today  Gait normal with good balance and coordination.  MSK: Non tender with full range of motion and good stability and symmetric strength and tone of shoulders, elbows,  wrist,  knee and ankles bilaterally.   Hip: Bilateral ROM IR: 15 Deg, ER: 25 Deg, Flexion: 120 Deg, Extension: 100 Deg, Abduction: 45 Deg, Adduction: 25 Deg Strength IR: 5/5, ER: 5/5, Flexion: 5/5, Extension: 5/5, Abduction: 5/5, Adduction: 5/5 Pelvic alignment unremarkable to inspection and palpation. Standing hip rotation and gait without trendelenburg sign / unsteadiness. Minimal tenderness over the greater trochanteric area bilaterally Mild pain over the piriformis and mild pain over the insertion of the hamstring. Mild tightness of Faber bilaterally No SI joint tenderness and normal minimal SI movement.  Procedure: Real-time Ultrasound Guided Injection of right ischial bursa Device: GE Logiq Q7 Ultrasound guided injection is preferred based studies that show increased duration, increased effect, greater accuracy, decreased procedural pain, increased response rate, and decreased cost with ultrasound guided versus blind injection.  Verbal informed consent obtained.  Time-out conducted.  Noted no overlying erythema, induration, or other signs of local infection.  Skin prepped in a sterile fashion.  Local anesthesia: Topical Ethyl chloride.  With sterile technique and under real time ultrasound guidance:  With a 21-gauge 2 inch needle patient was injected with a total of 0.5 mL of 0.5% Marcaine and 1 mL of Kenalog 40 mg/dL. Completed without difficulty  Pain immediately resolved suggesting accurate placement of the medication.  Advised to call if fevers/chills, erythema, induration, drainage, or persistent bleeding.  Images permanently stored and available for review in the ultrasound unit.  Impression: Technically successful ultrasound guided injection.  Procedure: Real-time Ultrasound Guided Injection of  left ischial bursa region hamstring origin Device: GE Logiq Q7 Ultrasound guided injection is preferred based studies that show increased duration, increased effect, greater  accuracy, decreased procedural pain, increased response rate, and decreased cost with ultrasound guided versus blind injection.  Verbal informed consent obtained.  Time-out conducted.  Noted no overlying erythema, induration, or other signs of local infection.  Skin prepped in a sterile fashion.  Local anesthesia: Topical Ethyl chloride.  With sterile technique and under real time ultrasound guidance:  The 21-gauge 2 inch no patient injected with 1 mL of 0.5% Marcaine and 1 mL of Kenalog 40 mg/dL Completed without difficulty  Pain immediately resolved suggesting accurate placement of the medication.  Advised to call if fevers/chills, erythema, induration, drainage, or persistent bleeding.  Images permanently stored and available for review in the ultrasound unit.  Impression: Technically successful ultrasound guided injection.    Impression and Recommendations:     This case required medical decision making of moderate complexity.      Note: This dictation was prepared with Dragon dictation along with smaller phrase technology. Any transcriptional errors that result from this process are unintentional.

## 2016-12-13 ENCOUNTER — Other Ambulatory Visit (INDEPENDENT_AMBULATORY_CARE_PROVIDER_SITE_OTHER): Payer: Medicare Other

## 2016-12-13 ENCOUNTER — Encounter: Payer: Self-pay | Admitting: Internal Medicine

## 2016-12-13 ENCOUNTER — Ambulatory Visit: Payer: Self-pay

## 2016-12-13 ENCOUNTER — Ambulatory Visit (INDEPENDENT_AMBULATORY_CARE_PROVIDER_SITE_OTHER): Payer: Medicare Other | Admitting: Internal Medicine

## 2016-12-13 ENCOUNTER — Encounter: Payer: Self-pay | Admitting: Family Medicine

## 2016-12-13 ENCOUNTER — Ambulatory Visit (INDEPENDENT_AMBULATORY_CARE_PROVIDER_SITE_OTHER): Payer: Medicare Other | Admitting: Family Medicine

## 2016-12-13 VITALS — BP 134/90 | HR 61 | Ht 68.0 in | Wt 171.5 lb

## 2016-12-13 DIAGNOSIS — R590 Localized enlarged lymph nodes: Secondary | ICD-10-CM

## 2016-12-13 DIAGNOSIS — M25551 Pain in right hip: Secondary | ICD-10-CM

## 2016-12-13 DIAGNOSIS — R070 Pain in throat: Secondary | ICD-10-CM | POA: Diagnosis not present

## 2016-12-13 DIAGNOSIS — K219 Gastro-esophageal reflux disease without esophagitis: Secondary | ICD-10-CM | POA: Diagnosis not present

## 2016-12-13 DIAGNOSIS — M25552 Pain in left hip: Secondary | ICD-10-CM | POA: Diagnosis not present

## 2016-12-13 DIAGNOSIS — G4700 Insomnia, unspecified: Secondary | ICD-10-CM | POA: Diagnosis not present

## 2016-12-13 DIAGNOSIS — M7070 Other bursitis of hip, unspecified hip: Secondary | ICD-10-CM

## 2016-12-13 LAB — BASIC METABOLIC PANEL
BUN: 13 mg/dL (ref 6–23)
CHLORIDE: 105 meq/L (ref 96–112)
CO2: 29 meq/L (ref 19–32)
Calcium: 9.7 mg/dL (ref 8.4–10.5)
Creatinine, Ser: 0.78 mg/dL (ref 0.40–1.20)
GFR: 78.23 mL/min (ref >60.00)
GLUCOSE: 133 mg/dL — AB (ref 70–99)
POTASSIUM: 4.4 meq/L (ref 3.5–5.1)
SODIUM: 141 meq/L (ref 135–145)

## 2016-12-13 LAB — CBC WITH DIFFERENTIAL/PLATELET
Basophils Absolute: 0 10*3/uL (ref 0.0–0.1)
Basophils Relative: 0.7 % (ref 0.0–3.0)
EOS ABS: 0.1 10*3/uL (ref 0.0–0.7)
Eosinophils Relative: 1 % (ref 0.0–5.0)
HEMATOCRIT: 42.9 % (ref 36.0–46.0)
HEMOGLOBIN: 14.3 g/dL (ref 12.0–15.0)
LYMPHS PCT: 32.5 % (ref 12.0–46.0)
Lymphs Abs: 2.3 10*3/uL (ref 0.7–4.0)
MCHC: 33.3 g/dL (ref 30.0–36.0)
MCV: 94.9 fl (ref 78.0–100.0)
Monocytes Absolute: 0.6 10*3/uL (ref 0.1–1.0)
Monocytes Relative: 8.1 % (ref 3.0–12.0)
Neutro Abs: 4.1 10*3/uL (ref 1.4–7.7)
Neutrophils Relative %: 57.7 % (ref 43.0–77.0)
Platelets: 376 10*3/uL (ref 150.0–400.0)
RBC: 4.52 Mil/uL (ref 3.87–5.11)
RDW: 13.3 % (ref 11.5–15.5)
WBC: 7.1 10*3/uL (ref 4.0–10.5)

## 2016-12-13 MED ORDER — TRAMADOL-ACETAMINOPHEN 37.5-325 MG PO TABS: 1.0000 | ORAL_TABLET | Freq: Three times a day (TID) | ORAL | 2 refills | Status: DC | PRN

## 2016-12-13 MED ORDER — TRIAMCINOLONE ACETONIDE 40 MG/ML IJ SUSP
40.0000 mg | Freq: Once | INTRAMUSCULAR | Status: AC
  Administered 2016-12-13: 40 mg via INTRAMUSCULAR

## 2016-12-13 MED ORDER — OMEPRAZOLE 40 MG PO CPDR: 40.0000 mg | DELAYED_RELEASE_CAPSULE | Freq: Every day | ORAL | 3 refills | Status: DC

## 2016-12-13 MED ORDER — GABAPENTIN 300 MG PO CAPS: ORAL_CAPSULE | ORAL | 3 refills | Status: DC

## 2016-12-13 MED ORDER — ESTRADIOL 1 MG PO TABS: 1.0000 mg | ORAL_TABLET | Freq: Every day | ORAL | 2 refills | Status: DC

## 2016-12-13 NOTE — Assessment & Plan Note (Signed)
s/p ENT consult 4/18 R

## 2016-12-13 NOTE — Assessment & Plan Note (Signed)
Worsening symptoms. Bilateral injections given today. Patient has responded fairly well to a PRP injection previously on left side.\We'll start with the home exercises, icing protocol, and will increase activity as tolerated. Patient will follow-up with me again in 3 weeks for further evaluation and treatment.

## 2016-12-13 NOTE — Patient Instructions (Addendum)
Good to se eyou  Injected both sides today  Gabapentin 300mg  at night Send me a message in 2-3 weeks and if not better we will consider MRI of your low back.  If better and comes back we may need to consider the PRP injection again.

## 2016-12-13 NOTE — Progress Notes (Signed)
Subjective:  Patient ID: Sheila Hall, female    DOB: 06-15-1951  Age: 66 y.o. MRN: [id number]  CC: Neck Pain (Pt stated rt side of the neck is painful/swollen for 4 months.)   HPI JENNIE HANNAY presents for OA, GERD, insomnia f/u. C/o R neck and throat pain x3  months - worse    Outpatient Medications Prior to Visit  Medication Sig Dispense Refill  . docusate sodium (COLACE) 100 MG capsule Take 100 mg by mouth 2 (two) times daily.    Marland Kitchen estradiol (ESTRACE) 1 MG tablet Take 1 mg by mouth daily.    Marland Kitchen gabapentin (NEURONTIN) 300 MG capsule nightly 90 capsule 3  . omeprazole (PRILOSEC) 40 MG capsule Take 40 mg by mouth daily.    . traMADol-acetaminophen (ULTRACET) 37.5-325 MG tablet Take 1 tablet by mouth every 6 (six) hours as needed.    . zolpidem (AMBIEN) 10 MG tablet      Facility-Administered Medications Prior to Visit  Medication Dose Route Frequency Provider Last Rate Last Dose  . DOBUTamine (DOBUTREX) 1,000 mcg/mL in sodium chloride 0.9 % 150 mL infusion  40 mcg/kg Intravenous Continuous Aleksei Plotnikov V, MD   40 mcg/kg/min at 06/09/12 1530    ROS Review of Systems  Constitutional: Negative for activity change, appetite change, chills, fatigue and unexpected weight change.  HENT: Negative for congestion, mouth sores and sinus pressure.   Eyes: Negative for visual disturbance.  Respiratory: Negative for cough and chest tightness.   Gastrointestinal: Negative for abdominal pain and nausea.  Genitourinary: Negative for difficulty urinating, frequency and vaginal pain.  Musculoskeletal: Positive for arthralgias and back pain. Negative for gait problem.  Skin: Negative for pallor and rash.  Neurological: Negative for dizziness, tremors, weakness, numbness and headaches.  Psychiatric/Behavioral: Negative for confusion, sleep disturbance and suicidal ideas.    Objective:  BP 136/82   Pulse 70   Temp 97.6 F (36.4 C)   Ht 5\' 8"  (1.727 m)   Wt 173 lb (78.5 kg)    SpO2 100%   BMI 26.30 kg/m   BP Readings from Last 3 Encounters:  12/13/16 136/82  12/13/16 134/90  09/06/16 136/82    Wt Readings from Last 3 Encounters:  12/13/16 173 lb (78.5 kg)  12/13/16 171 lb 8 oz (77.8 kg)  09/06/16 170 lb (77.1 kg)    Physical Exam  Constitutional: She appears well-developed. No distress.  HENT:  Head: Normocephalic.  Right Ear: External ear normal.  Left Ear: External ear normal.  Nose: Nose normal.  Mouth/Throat: Oropharynx is clear and moist.  Eyes: Conjunctivae are normal. Pupils are equal, round, and reactive to light. Right eye exhibits no discharge. Left eye exhibits no discharge.  Neck: Normal range of motion. Neck supple. No JVD present. No tracheal deviation present. No thyromegaly present.  Cardiovascular: Normal rate, regular rhythm and normal heart sounds.   Pulmonary/Chest: No stridor. No respiratory distress. She has no wheezes.  Abdominal: Soft. Bowel sounds are normal. She exhibits no distension and no mass. There is no tenderness. There is no rebound and no guarding.  Musculoskeletal: She exhibits no edema or tenderness.  Lymphadenopathy:    She has no cervical adenopathy.  Neurological: She displays normal reflexes. No cranial nerve deficit. She exhibits normal muscle tone. Coordination normal.  Skin: No rash noted. No erythema.  Psychiatric: She has a normal mood and affect. Her behavior is normal. Judgment and thought content normal.  R neck palp - tender - no mass  Lab  Results  Component Value Date   WBC 10.1 07/29/2016   HGB 14.0 07/29/2016   HCT 41.1 07/29/2016   PLT 373.0 07/29/2016   GLUCOSE 97 07/29/2016   CHOL 217 (H) 05/09/2015   TRIG 153.0 (H) 05/09/2015   HDL 58.30 05/09/2015   LDLDIRECT 106.2 09/16/2012   LDLCALC 128 (H) 05/09/2015   ALT 14 07/29/2016   AST 13 07/29/2016   NA 139 07/29/2016   K 4.1 07/29/2016   CL 103 07/29/2016   CREATININE 0.94 07/29/2016   BUN 15 07/29/2016   CO2 28 07/29/2016    TSH 1.00 07/29/2016   HGBA1C 5.6 08/05/2007    Dg Lumbar Spine Complete  Result Date: 09/06/2016 CLINICAL DATA:  Chronic right hip pain.  No injury. EXAM: LUMBAR SPINE - COMPLETE 4+ VIEW COMPARISON:  None. FINDINGS: There is no evidence of lumbar spine fracture. There are degenerative joint changes of the lower lumbar spine with narrowed joint space and osteophyte formation. There is mild scoliosis of spine. IMPRESSION: No acute abnormality.  Osteoarthritic changes of lower lumbar spine. Electronically Signed   By: Abelardo Diesel M.D.   On: 09/06/2016 12:40   [place] Extrem Low Right Ltd  Result Date: 09/11/2016 [facility] US performed of: Right This study was ordered, performed, and interpreted by Charlann Boxer D.O.  Hip: Trochanteric bursa with significant hypoechoic changes and swelling Acetabular labrum visualized and without tears, displacement, or effusion in joint. Femoral neck appears unremarkable without increased power doppler signal along Cortex.    Greater trochanter bursitis   Procedure: Real-time Ultrasound Guided Injection of right greater trochanteric bursitis secondary to patient's body habitus Device: GE Logiq E Ultrasound guided injection is preferred based studies that show increased duration, increased effect, greater accuracy, decreased procedural pain, increased response rate, and decreased cost with ultrasound guided versus blind injection. Verbal informed consent obtained. Time-out conducted. Noted no overlying erythema, induration, or other signs of local infection. Skin prepped in a sterile fashion. Local anesthesia: Topical Ethyl chloride. With sterile technique and under real time ultrasound guidance:  Greater trochanteric area was visualized and patient's bursa was noted. A 22-gauge 3 inch needle was inserted and 4 cc of 0.5% Marcaine and 1 cc of Kenalog 40 mg/dL was injected. Pictures taken Completed without difficulty Pain immediately resolved suggesting accurate placement of the  medication. Advised to call if fevers/chills, erythema, induration, drainage, or persistent bleeding. Images permanently stored and available for review in the ultrasound unit. Impression: Technically successful ultrasound guided injection.    Assessment & Plan:   There are no diagnoses linked to this encounter. I am having Ms. Gargan maintain her zolpidem, estradiol, docusate sodium, omeprazole, traMADol-acetaminophen, and gabapentin.  No orders of the defined types were placed in this encounter.    Follow-up: No Follow-up on file.  Walker Kehr, MD

## 2016-12-13 NOTE — Assessment & Plan Note (Signed)
Omeprazole

## 2016-12-13 NOTE — Assessment & Plan Note (Signed)
Zolpidem

## 2016-12-13 NOTE — Assessment & Plan Note (Signed)
R neck and throat pain x3  months - worse Labs CT ?etiology

## 2016-12-13 NOTE — Progress Notes (Signed)
Pre visit review using our clinic review tool, if applicable. No additional management support is needed unless otherwise documented below in the visit note. 

## 2016-12-16 ENCOUNTER — Encounter: Payer: Self-pay | Admitting: Family Medicine

## 2016-12-16 ENCOUNTER — Other Ambulatory Visit: Payer: Self-pay | Admitting: Internal Medicine

## 2016-12-16 ENCOUNTER — Other Ambulatory Visit: Payer: Self-pay

## 2016-12-16 DIAGNOSIS — M545 Low back pain, unspecified: Secondary | ICD-10-CM

## 2016-12-16 DIAGNOSIS — E785 Hyperlipidemia, unspecified: Secondary | ICD-10-CM

## 2016-12-16 DIAGNOSIS — R7309 Other abnormal glucose: Secondary | ICD-10-CM

## 2016-12-16 DIAGNOSIS — G8929 Other chronic pain: Secondary | ICD-10-CM

## 2016-12-16 MED ORDER — GABAPENTIN 300 MG PO CAPS: ORAL_CAPSULE | ORAL | 3 refills | Status: DC

## 2016-12-17 ENCOUNTER — Ambulatory Visit (INDEPENDENT_AMBULATORY_CARE_PROVIDER_SITE_OTHER)
Admission: RE | Admit: 2016-12-17 | Discharge: 2016-12-17 | Disposition: A | Discharge status: Home / Self Care | Payer: Medicare Other | Source: Ambulatory Visit | Attending: Internal Medicine | Admitting: Internal Medicine

## 2016-12-17 ENCOUNTER — Encounter: Payer: Self-pay | Admitting: Family Medicine

## 2016-12-17 ENCOUNTER — Other Ambulatory Visit: Payer: Self-pay | Admitting: Internal Medicine

## 2016-12-17 DIAGNOSIS — R070 Pain in throat: Secondary | ICD-10-CM

## 2016-12-17 DIAGNOSIS — D329 Benign neoplasm of meninges, unspecified: Secondary | ICD-10-CM

## 2016-12-17 DIAGNOSIS — M542 Cervicalgia: Secondary | ICD-10-CM | POA: Diagnosis not present

## 2016-12-17 DIAGNOSIS — R590 Localized enlarged lymph nodes: Secondary | ICD-10-CM | POA: Diagnosis not present

## 2016-12-17 DIAGNOSIS — D320 Benign neoplasm of cerebral meninges: Secondary | ICD-10-CM | POA: Diagnosis not present

## 2016-12-17 MED ORDER — GABAPENTIN 300 MG PO CAPS: 300.0000 mg | ORAL_CAPSULE | Freq: Every day | ORAL | 3 refills | Status: DC

## 2016-12-17 MED ORDER — IOPAMIDOL (ISOVUE-300) INJECTION 61%
75.0000 mL | Freq: Once | INTRAVENOUS | Status: AC | PRN
  Administered 2016-12-17: 75 mL via INTRAVENOUS

## 2016-12-18 ENCOUNTER — Telehealth: Payer: Self-pay | Admitting: Internal Medicine

## 2016-12-18 NOTE — Telephone Encounter (Signed)
Routing to dr plotnikov, please advise, thanks 

## 2016-12-18 NOTE — Telephone Encounter (Signed)
Called regarding the order for her neuro scan, needs to be with or without contrast. Or with just without Please change Order.

## 2016-12-18 NOTE — Telephone Encounter (Signed)
OK Pls change Thx

## 2016-12-19 ENCOUNTER — Encounter: Payer: Self-pay | Admitting: Internal Medicine

## 2016-12-19 DIAGNOSIS — D329 Benign neoplasm of meninges, unspecified: Secondary | ICD-10-CM

## 2016-12-19 DIAGNOSIS — H2513 Age-related nuclear cataract, bilateral: Secondary | ICD-10-CM | POA: Diagnosis not present

## 2016-12-20 ENCOUNTER — Encounter: Payer: Self-pay | Admitting: Internal Medicine

## 2016-12-20 NOTE — Telephone Encounter (Signed)
Changed.

## 2016-12-20 NOTE — Telephone Encounter (Signed)
Sheila Hall to check on order

## 2016-12-20 NOTE — Telephone Encounter (Signed)
The MRI Brain needs to be W/WO contrast. Please change. Thank you

## 2016-12-24 ENCOUNTER — Ambulatory Visit
Admission: RE | Admit: 2016-12-24 | Discharge: 2016-12-24 | Disposition: A | Discharge status: Home / Self Care | Payer: Medicare Other | Source: Ambulatory Visit | Attending: Internal Medicine | Admitting: Internal Medicine

## 2016-12-24 DIAGNOSIS — D329 Benign neoplasm of meninges, unspecified: Secondary | ICD-10-CM | POA: Diagnosis not present

## 2016-12-24 MED ORDER — GADOBENATE DIMEGLUMINE 529 MG/ML IV SOLN: 15.0000 mL | Freq: Once | INTRAVENOUS | Status: DC | PRN

## 2016-12-25 ENCOUNTER — Other Ambulatory Visit: Payer: Self-pay | Admitting: Internal Medicine

## 2016-12-25 ENCOUNTER — Encounter: Payer: Self-pay | Admitting: Internal Medicine

## 2016-12-25 DIAGNOSIS — D320 Benign neoplasm of cerebral meninges: Secondary | ICD-10-CM

## 2016-12-27 ENCOUNTER — Encounter: Payer: Self-pay | Admitting: Internal Medicine

## 2016-12-28 ENCOUNTER — Ambulatory Visit
Admission: RE | Admit: 2016-12-28 | Discharge: 2016-12-28 | Disposition: A | Discharge status: Home / Self Care | Payer: Medicare Other | Source: Ambulatory Visit | Attending: Family Medicine | Admitting: Family Medicine

## 2016-12-28 DIAGNOSIS — M545 Low back pain, unspecified: Secondary | ICD-10-CM

## 2016-12-28 DIAGNOSIS — M47816 Spondylosis without myelopathy or radiculopathy, lumbar region: Secondary | ICD-10-CM | POA: Diagnosis not present

## 2016-12-28 DIAGNOSIS — M549 Dorsalgia, unspecified: Secondary | ICD-10-CM | POA: Diagnosis not present

## 2016-12-30 ENCOUNTER — Encounter: Payer: Self-pay | Admitting: Family Medicine

## 2016-12-30 ENCOUNTER — Telehealth: Payer: Self-pay | Admitting: Internal Medicine

## 2016-12-30 NOTE — Telephone Encounter (Signed)
Spoke to [facility] - they just needed the MRI disc. Spoke with Pt and she has already picked it up to take with her,  so she is good to go for her appt.

## 2016-12-30 NOTE — Telephone Encounter (Signed)
Noted, thank you

## 2016-12-31 ENCOUNTER — Encounter: Payer: Self-pay | Admitting: Internal Medicine

## 2016-12-31 DIAGNOSIS — Z6825 Body mass index (BMI) 25.0-25.9, adult: Secondary | ICD-10-CM | POA: Diagnosis not present

## 2016-12-31 DIAGNOSIS — D329 Benign neoplasm of meninges, unspecified: Secondary | ICD-10-CM | POA: Diagnosis not present

## 2016-12-31 DIAGNOSIS — R030 Elevated blood-pressure reading, without diagnosis of hypertension: Secondary | ICD-10-CM | POA: Diagnosis not present

## 2017-01-03 DIAGNOSIS — M50322 Other cervical disc degeneration at C5-C6 level: Secondary | ICD-10-CM | POA: Diagnosis not present

## 2017-01-03 DIAGNOSIS — Z981 Arthrodesis status: Secondary | ICD-10-CM | POA: Diagnosis not present

## 2017-01-03 DIAGNOSIS — M50321 Other cervical disc degeneration at C4-C5 level: Secondary | ICD-10-CM | POA: Diagnosis not present

## 2017-01-03 DIAGNOSIS — M5031 Other cervical disc degeneration,  high cervical region: Secondary | ICD-10-CM | POA: Diagnosis not present

## 2017-01-03 DIAGNOSIS — M542 Cervicalgia: Secondary | ICD-10-CM | POA: Diagnosis not present

## 2017-01-03 DIAGNOSIS — D320 Benign neoplasm of cerebral meninges: Secondary | ICD-10-CM | POA: Diagnosis not present

## 2017-01-03 DIAGNOSIS — D329 Benign neoplasm of meninges, unspecified: Secondary | ICD-10-CM | POA: Diagnosis not present

## 2017-01-21 DIAGNOSIS — Z01818 Encounter for other preprocedural examination: Secondary | ICD-10-CM | POA: Diagnosis not present

## 2017-01-21 DIAGNOSIS — R110 Nausea: Secondary | ICD-10-CM | POA: Diagnosis not present

## 2017-01-21 DIAGNOSIS — D329 Benign neoplasm of meninges, unspecified: Secondary | ICD-10-CM | POA: Diagnosis not present

## 2017-01-21 DIAGNOSIS — M542 Cervicalgia: Secondary | ICD-10-CM | POA: Diagnosis not present

## 2017-01-21 DIAGNOSIS — I10 Essential (primary) hypertension: Secondary | ICD-10-CM | POA: Diagnosis not present

## 2017-01-21 DIAGNOSIS — D320 Benign neoplasm of cerebral meninges: Secondary | ICD-10-CM | POA: Diagnosis not present

## 2017-01-28 DIAGNOSIS — D320 Benign neoplasm of cerebral meninges: Secondary | ICD-10-CM | POA: Diagnosis not present

## 2017-01-28 DIAGNOSIS — R110 Nausea: Secondary | ICD-10-CM | POA: Diagnosis not present

## 2017-01-28 DIAGNOSIS — M542 Cervicalgia: Secondary | ICD-10-CM | POA: Diagnosis not present

## 2017-01-28 DIAGNOSIS — Z86011 Personal history of benign neoplasm of the brain: Secondary | ICD-10-CM | POA: Diagnosis not present

## 2017-01-28 DIAGNOSIS — I10 Essential (primary) hypertension: Secondary | ICD-10-CM | POA: Diagnosis not present

## 2017-01-28 DIAGNOSIS — D329 Benign neoplasm of meninges, unspecified: Secondary | ICD-10-CM | POA: Diagnosis not present

## 2017-01-28 DIAGNOSIS — Z0389 Encounter for observation for other suspected diseases and conditions ruled out: Secondary | ICD-10-CM | POA: Diagnosis not present

## 2017-02-10 ENCOUNTER — Inpatient Hospital Stay: Payer: Medicare Other | Admitting: Internal Medicine

## 2017-03-06 DIAGNOSIS — M4807 Spinal stenosis, lumbosacral region: Secondary | ICD-10-CM | POA: Diagnosis not present

## 2017-06-04 DIAGNOSIS — Z23 Encounter for immunization: Secondary | ICD-10-CM | POA: Diagnosis not present

## 2017-07-03 DIAGNOSIS — Z86011 Personal history of benign neoplasm of the brain: Secondary | ICD-10-CM | POA: Diagnosis not present

## 2017-07-07 ENCOUNTER — Encounter: Payer: Self-pay | Admitting: Internal Medicine

## 2017-07-07 ENCOUNTER — Other Ambulatory Visit (INDEPENDENT_AMBULATORY_CARE_PROVIDER_SITE_OTHER): Payer: Medicare Other

## 2017-07-07 ENCOUNTER — Ambulatory Visit (INDEPENDENT_AMBULATORY_CARE_PROVIDER_SITE_OTHER): Payer: Medicare Other | Admitting: Internal Medicine

## 2017-07-07 VITALS — BP 122/74 | HR 63 | Temp 98.0°F | Ht 68.0 in | Wt 173.0 lb

## 2017-07-07 DIAGNOSIS — E785 Hyperlipidemia, unspecified: Secondary | ICD-10-CM

## 2017-07-07 DIAGNOSIS — Z0000 Encounter for general adult medical examination without abnormal findings: Secondary | ICD-10-CM

## 2017-07-07 DIAGNOSIS — G4700 Insomnia, unspecified: Secondary | ICD-10-CM

## 2017-07-07 DIAGNOSIS — D329 Benign neoplasm of meninges, unspecified: Secondary | ICD-10-CM

## 2017-07-07 DIAGNOSIS — R32 Unspecified urinary incontinence: Secondary | ICD-10-CM

## 2017-07-07 DIAGNOSIS — M542 Cervicalgia: Secondary | ICD-10-CM

## 2017-07-07 DIAGNOSIS — G8929 Other chronic pain: Secondary | ICD-10-CM | POA: Diagnosis not present

## 2017-07-07 LAB — TSH: TSH: 1.77 u[IU]/mL (ref 0.35–4.50)

## 2017-07-07 LAB — CBC WITH DIFFERENTIAL/PLATELET
BASOS PCT: 0.9 % (ref 0.0–3.0)
Basophils Absolute: 0.1 10*3/uL (ref 0.0–0.1)
EOS PCT: 1.8 % (ref 0.0–5.0)
Eosinophils Absolute: 0.1 10*3/uL (ref 0.0–0.7)
HEMATOCRIT: 43.9 % (ref 36.0–46.0)
HEMOGLOBIN: 14.3 g/dL (ref 12.0–15.0)
LYMPHS PCT: 36.2 % (ref 12.0–46.0)
Lymphs Abs: 2.9 10*3/uL (ref 0.7–4.0)
MCHC: 32.5 g/dL (ref 30.0–36.0)
MCV: 95.3 fl (ref 78.0–100.0)
MONOS PCT: 7.6 % (ref 3.0–12.0)
Monocytes Absolute: 0.6 10*3/uL (ref 0.1–1.0)
Neutro Abs: 4.3 10*3/uL (ref 1.4–7.7)
Neutrophils Relative %: 53.5 % (ref 43.0–77.0)
Platelets: 335 10*3/uL (ref 150.0–400.0)
RBC: 4.6 Mil/uL (ref 3.87–5.11)
RDW: 13.2 % (ref 11.5–15.5)
WBC: 8.1 10*3/uL (ref 4.0–10.5)

## 2017-07-07 LAB — LIPID PANEL
CHOL/HDL RATIO: 3
Cholesterol: 207 mg/dL — ABNORMAL HIGH (ref 0–200)
HDL: 65.9 mg/dL (ref >39.00)
LDL CALC: 111 mg/dL — AB (ref 0–99)
NonHDL: 140.69
TRIGLYCERIDES: 149 mg/dL (ref 0.0–149.0)
VLDL: 29.8 mg/dL (ref 0.0–40.0)

## 2017-07-07 LAB — URINALYSIS
Bilirubin Urine: NEGATIVE
Hgb urine dipstick: NEGATIVE
Ketones, ur: NEGATIVE
Leukocytes, UA: NEGATIVE
Nitrite: NEGATIVE
SPECIFIC GRAVITY, URINE: 1.02 (ref 1.000–1.030)
Total Protein, Urine: NEGATIVE
Urine Glucose: NEGATIVE
Urobilinogen, UA: 0.2 (ref 0.0–1.0)
pH: 6 (ref 5.0–8.0)

## 2017-07-07 LAB — BASIC METABOLIC PANEL
BUN: 14 mg/dL (ref 6–23)
CHLORIDE: 103 meq/L (ref 96–112)
CO2: 27 mEq/L (ref 19–32)
Calcium: 9.6 mg/dL (ref 8.4–10.5)
Creatinine, Ser: 0.71 mg/dL (ref 0.40–1.20)
GFR: 87.05 mL/min (ref >60.00)
Glucose, Bld: 96 mg/dL (ref 70–99)
POTASSIUM: 4.6 meq/L (ref 3.5–5.1)
SODIUM: 141 meq/L (ref 135–145)

## 2017-07-07 LAB — HEPATIC FUNCTION PANEL
ALT: 15 U/L (ref 0–35)
AST: 16 U/L (ref 0–37)
Albumin: 4.4 g/dL (ref 3.5–5.2)
Alkaline Phosphatase: 56 U/L (ref 39–117)
BILIRUBIN DIRECT: 0.1 mg/dL (ref 0.0–0.3)
BILIRUBIN TOTAL: 0.4 mg/dL (ref 0.2–1.2)
TOTAL PROTEIN: 7.1 g/dL (ref 6.0–8.3)

## 2017-07-07 MED ORDER — GABAPENTIN 300 MG PO CAPS: 300.0000 mg | ORAL_CAPSULE | Freq: Every day | ORAL | 3 refills | Status: DC

## 2017-07-07 MED ORDER — TRAMADOL-ACETAMINOPHEN 37.5-325 MG PO TABS: 1.0000 | ORAL_TABLET | Freq: Three times a day (TID) | ORAL | 2 refills | Status: DC | PRN

## 2017-07-07 MED ORDER — CIPROFLOXACIN HCL 500 MG PO TABS: 500.0000 mg | ORAL_TABLET | Freq: Two times a day (BID) | ORAL | 0 refills | Status: AC

## 2017-07-07 MED ORDER — ZOLPIDEM TARTRATE 10 MG PO TABS: 10.0000 mg | ORAL_TABLET | Freq: Every evening | ORAL | 1 refills | Status: DC | PRN

## 2017-07-07 NOTE — Progress Notes (Addendum)
Subjective:  Patient ID: Sheila Hall, female    DOB: November 30, 1950  Age: 66 y.o. MRN: [id number]  CC: No chief complaint on file.   HPI Sheila Hall presents for meningioma, neck pain/OA, GERD f/u. Brain surgery went well  Outpatient Medications Prior to Visit  Medication Sig Dispense Refill  . docusate sodium (COLACE) 100 MG capsule Take 100 mg by mouth 2 (two) times daily.    Marland Kitchen estradiol (ESTRACE) 1 MG tablet Take 1 tablet (1 mg total) by mouth daily. 90 tablet 2  . gabapentin (NEURONTIN) 300 MG capsule Take 1 capsule (300 mg total) by mouth at bedtime. 90 capsule 3  . omeprazole (PRILOSEC) 40 MG capsule Take 1 capsule (40 mg total) by mouth daily. 90 capsule 3  . traMADol-acetaminophen (ULTRACET) 37.5-325 MG tablet Take 1 tablet by mouth every 8 (eight) hours as needed. 90 tablet 2  . zolpidem (AMBIEN) 10 MG tablet      Facility-Administered Medications Prior to Visit  Medication Dose Route Frequency Provider Last Rate Last Dose  . DOBUTamine (DOBUTREX) 1,000 mcg/mL in sodium chloride 0.9 % 150 mL infusion  40 mcg/kg Intravenous Continuous Plotnikov, Aleksei V, MD   40 mcg/kg/min at 06/09/12 1530    ROS Review of Systems  Constitutional: Negative for activity change, appetite change, chills, fatigue and unexpected weight change.  HENT: Negative for congestion, mouth sores and sinus pressure.   Eyes: Negative for visual disturbance.  Respiratory: Negative for cough and chest tightness.   Gastrointestinal: Negative for abdominal pain and nausea.  Genitourinary: Positive for frequency. Negative for difficulty urinating and vaginal pain.  Musculoskeletal: Positive for arthralgias, back pain, neck pain and neck stiffness. Negative for gait problem.  Skin: Negative for pallor and rash.  Neurological: Negative for dizziness, tremors, weakness, numbness and headaches.  Psychiatric/Behavioral: Negative for confusion, sleep disturbance and suicidal ideas. The patient is  nervous/anxious.     Objective:  BP 122/74 (BP Location: Left Arm, Patient Position: Sitting, Cuff Size: Normal)   Pulse 63   Temp 98 F (36.7 C) (Oral)   Ht 5\' 8"  (1.727 m)   Wt 173 lb (78.5 kg)   SpO2 100%   BMI 26.30 kg/m   BP Readings from Last 3 Encounters:  07/07/17 122/74  12/13/16 136/82  12/13/16 134/90    Wt Readings from Last 3 Encounters:  07/07/17 173 lb (78.5 kg)  12/13/16 173 lb (78.5 kg)  12/13/16 171 lb 8 oz (77.8 kg)    Physical Exam  Constitutional: She appears well-developed. No distress.  HENT:  Head: Normocephalic.  Right Ear: External ear normal.  Left Ear: External ear normal.  Nose: Nose normal.  Mouth/Throat: Oropharynx is clear and moist.  Eyes: Pupils are equal, round, and reactive to light. Conjunctivae are normal. Right eye exhibits no discharge. Left eye exhibits no discharge.  Neck: Normal range of motion. Neck supple. No JVD present. No tracheal deviation present. No thyromegaly present.  Cardiovascular: Normal rate, regular rhythm and normal heart sounds.   Pulmonary/Chest: No stridor. No respiratory distress. She has no wheezes.  Abdominal: Soft. Bowel sounds are normal. She exhibits no distension and no mass. There is no tenderness. There is no rebound and no guarding.  Musculoskeletal: She exhibits tenderness. She exhibits no edema.  Lymphadenopathy:    She has no cervical adenopathy.  Neurological: She displays normal reflexes. No cranial nerve deficit. She exhibits normal muscle tone. Coordination normal.  Skin: No rash noted. No erythema.  Psychiatric: She has a  normal mood and affect. Her behavior is normal. Judgment and thought content normal.    Lab Results  Component Value Date   WBC 7.1 12/13/2016   HGB 14.3 12/13/2016   HCT 42.9 12/13/2016   PLT 376.0 12/13/2016   GLUCOSE 133 (H) 12/13/2016   CHOL 217 (H) 05/09/2015   TRIG 153.0 (H) 05/09/2015   HDL 58.30 05/09/2015   LDLDIRECT 106.2 09/16/2012   LDLCALC 128 (H)  05/09/2015   ALT 14 07/29/2016   AST 13 07/29/2016   NA 141 12/13/2016   K 4.4 12/13/2016   CL 105 12/13/2016   CREATININE 0.78 12/13/2016   BUN 13 12/13/2016   CO2 29 12/13/2016   TSH 1.00 07/29/2016   HGBA1C 5.6 08/05/2007    Mr Lumbar Spine Wo Contrast  Result Date: 12/28/2016 CLINICAL DATA:  66 y/o F; bilateral buttocks pain. History of lumbar surgery in the late 1980s. EXAM: MRI LUMBAR SPINE WITHOUT CONTRAST TECHNIQUE: Multiplanar, multisequence MR imaging of the lumbar spine was performed. No intravenous contrast was administered. COMPARISON:  None. FINDINGS: Segmentation:  Standard. Alignment: Mild lumbar dextrocurvature with apex at L3. Stable minimal grade 1 L4-5 anterolisthesis. Vertebrae: No evidence for fracture or discitis. T1/T2 hyperintense focus within the left-sided L3 pedicle and posterior vertebral body is compatible with hemangioma. Left S1 chronic laminectomy changes. Conus medullaris: Extends to the upper L2 level and appears normal. Paraspinal and other soft tissues: Negative. Disc levels: L1-2: No significant disc displacement, foraminal narrowing, or canal stenosis. L2-3: Minimal disc bulge and mild facet hypertrophy. No significant foraminal narrowing or canal stenosis. Small disc bulge with mild facet and ligamentum flavum hypertrophy. No significant foraminal narrowing or canal stenosis. Mild lateral recess narrowing. L3-4: Minimal anterolisthesis with small uncovered disc bulge eccentric to the right and moderate right-greater-than-left facet hypertrophy. There is mild right foraminal narrowing. Right greater than left lateral recess narrowing with contact upon the descending right L5 nerve root in the lateral recess. No significant canal stenosis. L4-5: Minimal disc bulge with marginal osteophytes and mild facet hypertrophy. No significant foraminal narrowing or canal stenosis. L5-S1: No significant disc displacement, foraminal narrowing, or canal stenosis. IMPRESSION: 1.  No acute osseous abnormality identified. 2. Mild lumbar dextrocurvature and stable minimal grade 1 L4-5 anterolisthesis. 3. Mild lumbar degenerative changes greatest at the L4-5 level were disc and facet disease mildly narrows the right neural foramen and there is contact upon the descending right L5 nerve root in the right lateral recess. 4. No significant canal stenosis. Electronically Signed   By: Kristine Garbe M.D.   On: 12/28/2016 18:05   Mr Pelvis Wo Contrast  Result Date: 12/29/2016 CLINICAL DATA:  Bilateral buttock pain for 1.5 years. No known injury. EXAM: MRI PELVIS WITHOUT CONTRAST TECHNIQUE: Multiplanar multisequence MR imaging of the pelvis was performed. No intravenous contrast was administered. COMPARISON:  MRI of the pelvis 11/19/2014. FINDINGS: Mild degenerative change about the sacroiliac joints is again seen. There is some subchondral edema in the right sacrum adjacent to the SI joint. Minimal edema in the left sacrum adjacent to the SI joint appears improved compared to the prior study. Also seen is mild degenerative change about the hips tiny subchondral cyst in the left acetabular roof is identified. There is no acute bony or joint abnormality. No avascular necrosis of the femoral heads. No worrisome bony lesion. There is no hip joint effusion. No evidence of bursitis is identified. Mild hamstring tendinosis on the left seen on the prior examination appears resolved. There is new  mild tendinosis of the right hamstring origin. No tear. Musculature otherwise appears normal. Imaged intrapelvic contents demonstrate postoperative change of hysterectomy. Extensive diverticular disease of the imaged descending and sigmoid colon is identified. IMPRESSION: No acute abnormality. Mild bilateral SI joint and hip degenerative disease bilaterally is not notably changed compared to the prior study. Mild tendinosis of the right hamstring origin without tear is new since the prior study.  Tendinosis at the left hamstring origin seen on the prior MRI has resolved. Diverticulosis without diverticulitis. Electronically Signed   By: Inge Rise M.D.   On: 12/29/2016 09:11    Assessment & Plan:   There are no diagnoses linked to this encounter. I am having Ms. Amer maintain her zolpidem, docusate sodium, traMADol-acetaminophen, omeprazole, estradiol, and gabapentin.  No orders of the defined types were placed in this encounter.    Follow-up: No Follow-up on file.  Walker Kehr, MD

## 2017-07-07 NOTE — Assessment & Plan Note (Signed)

## 2017-07-07 NOTE — Assessment & Plan Note (Signed)
Zolpidem prn  Potential benefits of a long term benzodiazepines  use as well as potential risks  and complications were explained to the patient and were aknowledged. 

## 2017-07-07 NOTE — Assessment & Plan Note (Signed)
Tramadol/APAP prn  Potential benefits of a long term opioids use as well as potential risks (i.e. addiction risk, apnea etc) and complications (i.e. Somnolence, constipation and others) were explained to the patient and were aknowledged.

## 2017-07-07 NOTE — Assessment & Plan Note (Signed)
R brain - s/p resection

## 2017-07-10 DIAGNOSIS — M4316 Spondylolisthesis, lumbar region: Secondary | ICD-10-CM | POA: Diagnosis not present

## 2017-07-10 DIAGNOSIS — D329 Benign neoplasm of meninges, unspecified: Secondary | ICD-10-CM | POA: Diagnosis not present

## 2017-07-10 DIAGNOSIS — R030 Elevated blood-pressure reading, without diagnosis of hypertension: Secondary | ICD-10-CM | POA: Diagnosis not present

## 2017-07-10 DIAGNOSIS — Z6825 Body mass index (BMI) 25.0-25.9, adult: Secondary | ICD-10-CM | POA: Diagnosis not present

## 2017-08-13 DIAGNOSIS — Z1231 Encounter for screening mammogram for malignant neoplasm of breast: Secondary | ICD-10-CM | POA: Diagnosis not present

## 2017-08-13 LAB — HM MAMMOGRAPHY

## 2017-08-26 DIAGNOSIS — M48062 Spinal stenosis, lumbar region with neurogenic claudication: Secondary | ICD-10-CM | POA: Diagnosis not present

## 2017-09-16 ENCOUNTER — Encounter: Payer: Self-pay | Admitting: Internal Medicine

## 2017-09-16 NOTE — Progress Notes (Signed)
Outside notes received. Information abstracted. Notes sent to scan.  

## 2017-09-22 ENCOUNTER — Encounter: Payer: Self-pay | Admitting: Internal Medicine

## 2017-09-24 ENCOUNTER — Encounter: Payer: Self-pay | Admitting: Internal Medicine

## 2017-09-29 ENCOUNTER — Other Ambulatory Visit: Payer: Self-pay | Admitting: Internal Medicine

## 2017-09-29 NOTE — Telephone Encounter (Signed)
Rx loaded

## 2017-09-29 NOTE — Telephone Encounter (Signed)
Copied from [place] [id number]. Topic: Quick Communication - Rx Refill/Question >> Sep 29, 2017 11:24 AM Waylan Rocher, Lumin L wrote: Medication: traMADol-acetaminophen (ULTRACET) 37.5-325 MG tablet (30 day supply) Has the patient contacted their pharmacy? Yes.   (Agent: If no, request that the patient contact the pharmacy for the refill.) Preferred Pharmacy (with phone number or street name): [facility] [address] [place] [address] P: [phone] Agent: Please be advised that RX refills may take up to 3 business days. We ask that you follow-up with your pharmacy. Patient says she's sent multiple Mychart messages RE this script and the insurance needs Dr. Alain Marion to contact the clinical review team b/c of insurance changes.

## 2017-10-01 NOTE — Telephone Encounter (Signed)
PA approved--good through 09/08/18

## 2017-10-07 MED ORDER — TRAMADOL-ACETAMINOPHEN 37.5-325 MG PO TABS: 1.0000 | ORAL_TABLET | Freq: Three times a day (TID) | ORAL | 2 refills | Status: DC | PRN

## 2017-10-13 DIAGNOSIS — R1032 Left lower quadrant pain: Secondary | ICD-10-CM | POA: Diagnosis not present

## 2017-10-13 DIAGNOSIS — K5730 Diverticulosis of large intestine without perforation or abscess without bleeding: Secondary | ICD-10-CM | POA: Diagnosis not present

## 2017-10-13 DIAGNOSIS — Z9049 Acquired absence of other specified parts of digestive tract: Secondary | ICD-10-CM | POA: Diagnosis not present

## 2017-10-13 DIAGNOSIS — K5792 Diverticulitis of intestine, part unspecified, without perforation or abscess without bleeding: Secondary | ICD-10-CM | POA: Diagnosis not present

## 2017-10-13 DIAGNOSIS — K5732 Diverticulitis of large intestine without perforation or abscess without bleeding: Secondary | ICD-10-CM | POA: Diagnosis not present

## 2017-10-20 DIAGNOSIS — K5732 Diverticulitis of large intestine without perforation or abscess without bleeding: Secondary | ICD-10-CM | POA: Diagnosis not present

## 2017-12-03 ENCOUNTER — Encounter: Payer: Self-pay | Admitting: Internal Medicine

## 2017-12-03 MED ORDER — OMEPRAZOLE 40 MG PO CPDR: 40.0000 mg | DELAYED_RELEASE_CAPSULE | Freq: Every day | ORAL | 0 refills | Status: DC

## 2018-01-06 ENCOUNTER — Encounter: Payer: Self-pay | Admitting: Internal Medicine

## 2018-01-09 ENCOUNTER — Other Ambulatory Visit: Payer: Self-pay | Admitting: Internal Medicine

## 2018-01-09 MED ORDER — TRAMADOL-ACETAMINOPHEN 37.5-325 MG PO TABS: 1.0000 | ORAL_TABLET | Freq: Three times a day (TID) | ORAL | 3 refills | Status: DC | PRN

## 2018-02-12 DIAGNOSIS — M4316 Spondylolisthesis, lumbar region: Secondary | ICD-10-CM | POA: Diagnosis not present

## 2018-02-12 DIAGNOSIS — M48062 Spinal stenosis, lumbar region with neurogenic claudication: Secondary | ICD-10-CM | POA: Diagnosis not present

## 2018-02-17 DIAGNOSIS — K5792 Diverticulitis of intestine, part unspecified, without perforation or abscess without bleeding: Secondary | ICD-10-CM | POA: Diagnosis not present

## 2018-02-17 DIAGNOSIS — K219 Gastro-esophageal reflux disease without esophagitis: Secondary | ICD-10-CM | POA: Diagnosis not present

## 2018-02-17 DIAGNOSIS — G64 Other disorders of peripheral nervous system: Secondary | ICD-10-CM | POA: Diagnosis not present

## 2018-02-25 DIAGNOSIS — K219 Gastro-esophageal reflux disease without esophagitis: Secondary | ICD-10-CM | POA: Diagnosis not present

## 2018-02-25 DIAGNOSIS — R109 Unspecified abdominal pain: Secondary | ICD-10-CM | POA: Diagnosis not present

## 2018-02-25 DIAGNOSIS — E538 Deficiency of other specified B group vitamins: Secondary | ICD-10-CM | POA: Diagnosis not present

## 2018-02-25 DIAGNOSIS — E559 Vitamin D deficiency, unspecified: Secondary | ICD-10-CM | POA: Diagnosis not present

## 2018-02-25 DIAGNOSIS — G64 Other disorders of peripheral nervous system: Secondary | ICD-10-CM | POA: Diagnosis not present

## 2018-02-25 DIAGNOSIS — R1011 Right upper quadrant pain: Secondary | ICD-10-CM | POA: Diagnosis not present

## 2018-02-27 DIAGNOSIS — R109 Unspecified abdominal pain: Secondary | ICD-10-CM | POA: Diagnosis not present

## 2018-02-27 DIAGNOSIS — E860 Dehydration: Secondary | ICD-10-CM | POA: Diagnosis not present

## 2018-03-13 ENCOUNTER — Ambulatory Visit (INDEPENDENT_AMBULATORY_CARE_PROVIDER_SITE_OTHER)
Admission: RE | Admit: 2018-03-13 | Discharge: 2018-03-13 | Disposition: A | Discharge status: Home / Self Care | Payer: Medicare Other | Source: Ambulatory Visit | Attending: Internal Medicine | Admitting: Internal Medicine

## 2018-03-13 ENCOUNTER — Encounter: Payer: Self-pay | Admitting: Internal Medicine

## 2018-03-13 ENCOUNTER — Ambulatory Visit (INDEPENDENT_AMBULATORY_CARE_PROVIDER_SITE_OTHER): Payer: Medicare Other | Admitting: Internal Medicine

## 2018-03-13 VITALS — BP 164/90 | HR 60 | Ht 68.0 in | Wt 167.0 lb

## 2018-03-13 DIAGNOSIS — Z0000 Encounter for general adult medical examination without abnormal findings: Secondary | ICD-10-CM

## 2018-03-13 DIAGNOSIS — R0781 Pleurodynia: Secondary | ICD-10-CM | POA: Diagnosis not present

## 2018-03-13 DIAGNOSIS — R5383 Other fatigue: Secondary | ICD-10-CM | POA: Diagnosis not present

## 2018-03-13 DIAGNOSIS — R0789 Other chest pain: Secondary | ICD-10-CM

## 2018-03-13 MED ORDER — GABAPENTIN 300 MG PO CAPS: 300.0000 mg | ORAL_CAPSULE | Freq: Every day | ORAL | 3 refills | Status: DC

## 2018-03-13 MED ORDER — OMEPRAZOLE 40 MG PO CPDR: 40.0000 mg | DELAYED_RELEASE_CAPSULE | Freq: Every day | ORAL | 3 refills | Status: DC

## 2018-03-13 MED ORDER — TRAMADOL-ACETAMINOPHEN 37.5-325 MG PO TABS: 1.0000 | ORAL_TABLET | Freq: Three times a day (TID) | ORAL | 3 refills | Status: DC | PRN

## 2018-03-13 MED ORDER — ZOLPIDEM TARTRATE 10 MG PO TABS: 10.0000 mg | ORAL_TABLET | Freq: Every evening | ORAL | 1 refills | Status: DC | PRN

## 2018-03-13 NOTE — Progress Notes (Signed)
Subjective:  Patient ID: Sheila Hall, female    DOB: Sep 01, 1951  Age: 67 y.o. MRN: [id number]  CC: No chief complaint on file.   HPI Sheila Hall presents for HTN, LBP, pain in the R flank. UA/US did not show stone [facility]). Now pain is irrad down to the RLE.  Just had diverticulitis in June - 3 attacks since Oct 2018 Had abd CT in [place] [facility] exam  Outpatient Medications Prior to Visit  Medication Sig Dispense Refill  . diclofenac (VOLTAREN) 75 MG EC tablet Take 1 tablet by mouth daily.  0  . docusate sodium (COLACE) 100 MG capsule Take 100 mg by mouth 2 (two) times daily.    Marland Kitchen estradiol (ESTRACE) 1 MG tablet Take 1 tablet (1 mg total) by mouth daily. 90 tablet 2  . gabapentin (NEURONTIN) 300 MG capsule Take 1 capsule (300 mg total) by mouth at bedtime. 90 capsule 3  . omeprazole (PRILOSEC) 40 MG capsule Take 1 capsule (40 mg total) by mouth daily. 90 capsule 0  . traMADol-acetaminophen (ULTRACET) 37.5-325 MG tablet Take 1 tablet by mouth every 8 (eight) hours as needed. 90 tablet 3  . zolpidem (AMBIEN) 10 MG tablet Take 1 tablet (10 mg total) by mouth at bedtime as needed for sleep. 90 tablet 1   Facility-Administered Medications Prior to Visit  Medication Dose Route Frequency Provider Last Rate Last Dose  . DOBUTamine (DOBUTREX) 1,000 mcg/mL in sodium chloride 0.9 % 150 mL infusion  40 mcg/kg Intravenous Continuous Rowyn Spilde, Evie Lacks, MD   40 mcg/kg/min at 06/09/12 1530    ROS: Review of Systems  Constitutional: Negative for activity change, appetite change, chills, fatigue and unexpected weight change.  HENT: Negative for congestion, mouth sores and sinus pressure.   Eyes: Negative for visual disturbance.  Respiratory: Positive for chest tightness. Negative for cough.   Gastrointestinal: Negative for abdominal pain and nausea.  Genitourinary: Positive for frequency. Negative for difficulty urinating and vaginal pain.  Musculoskeletal: Positive for back  pain. Negative for gait problem.  Skin: Negative for pallor and rash.  Neurological: Negative for dizziness, tremors, weakness, numbness and headaches.  Psychiatric/Behavioral: Negative for confusion and sleep disturbance.    Objective:  BP (!) 164/90 (BP Location: Left Arm, Patient Position: Sitting, Cuff Size: Normal)   Pulse 60   Ht 5\' 8"  (1.727 m)   Wt 167 lb (75.8 kg)   SpO2 98%   BMI 25.39 kg/m   BP Readings from Last 3 Encounters:  03/13/18 (!) 164/90  07/07/17 122/74  12/13/16 136/82    Wt Readings from Last 3 Encounters:  03/13/18 167 lb (75.8 kg)  07/07/17 173 lb (78.5 kg)  12/13/16 173 lb (78.5 kg)    Physical Exam  Constitutional: She appears well-developed. No distress.  HENT:  Head: Normocephalic.  Right Ear: External ear normal.  Left Ear: External ear normal.  Nose: Nose normal.  Mouth/Throat: Oropharynx is clear and moist.  Eyes: Pupils are equal, round, and reactive to light. Conjunctivae are normal. Right eye exhibits no discharge. Left eye exhibits no discharge.  Neck: Normal range of motion. Neck supple. No JVD present. No tracheal deviation present. No thyromegaly present.  Cardiovascular: Normal rate, regular rhythm and normal heart sounds.  Pulmonary/Chest: No stridor. No respiratory distress. She has no wheezes.  Abdominal: Soft. Bowel sounds are normal. She exhibits no distension and no mass. There is no tenderness. There is no rebound and no guarding.  Musculoskeletal: She exhibits tenderness. She exhibits no  edema.  Lymphadenopathy:    She has no cervical adenopathy.  Neurological: She displays normal reflexes. No cranial nerve deficit. She exhibits normal muscle tone. Coordination normal.  Skin: No rash noted. No erythema.  Psychiatric: She has a normal mood and affect. Her behavior is normal. Judgment and thought content normal.  R LS tender  R chest pain  Procedure: EKG Indication: chest pain Impression: NSR. No acute changes.  Lab  Results  Component Value Date   WBC 8.1 07/07/2017   HGB 14.3 07/07/2017   HCT 43.9 07/07/2017   PLT 335.0 07/07/2017   GLUCOSE 96 07/07/2017   CHOL 207 (H) 07/07/2017   TRIG 149.0 07/07/2017   HDL 65.90 07/07/2017   LDLDIRECT 106.2 09/16/2012   LDLCALC 111 (H) 07/07/2017   ALT 15 07/07/2017   AST 16 07/07/2017   NA 141 07/07/2017   K 4.6 07/07/2017   CL 103 07/07/2017   CREATININE 0.71 07/07/2017   BUN 14 07/07/2017   CO2 27 07/07/2017   TSH 1.77 07/07/2017   HGBA1C 5.6 08/05/2007    Mr Lumbar Spine Wo Contrast  Result Date: 12/28/2016 CLINICAL DATA:  67 y/o F; bilateral buttocks pain. History of lumbar surgery in the late 1980s. EXAM: MRI LUMBAR SPINE WITHOUT CONTRAST TECHNIQUE: Multiplanar, multisequence MR imaging of the lumbar spine was performed. No intravenous contrast was administered. COMPARISON:  None. FINDINGS: Segmentation:  Standard. Alignment: Mild lumbar dextrocurvature with apex at L3. Stable minimal grade 1 L4-5 anterolisthesis. Vertebrae: No evidence for fracture or discitis. T1/T2 hyperintense focus within the left-sided L3 pedicle and posterior vertebral body is compatible with hemangioma. Left S1 chronic laminectomy changes. Conus medullaris: Extends to the upper L2 level and appears normal. Paraspinal and other soft tissues: Negative. Disc levels: L1-2: No significant disc displacement, foraminal narrowing, or canal stenosis. L2-3: Minimal disc bulge and mild facet hypertrophy. No significant foraminal narrowing or canal stenosis. Small disc bulge with mild facet and ligamentum flavum hypertrophy. No significant foraminal narrowing or canal stenosis. Mild lateral recess narrowing. L3-4: Minimal anterolisthesis with small uncovered disc bulge eccentric to the right and moderate right-greater-than-left facet hypertrophy. There is mild right foraminal narrowing. Right greater than left lateral recess narrowing with contact upon the descending right L5 nerve root in the  lateral recess. No significant canal stenosis. L4-5: Minimal disc bulge with marginal osteophytes and mild facet hypertrophy. No significant foraminal narrowing or canal stenosis. L5-S1: No significant disc displacement, foraminal narrowing, or canal stenosis. IMPRESSION: 1. No acute osseous abnormality identified. 2. Mild lumbar dextrocurvature and stable minimal grade 1 L4-5 anterolisthesis. 3. Mild lumbar degenerative changes greatest at the L4-5 level were disc and facet disease mildly narrows the right neural foramen and there is contact upon the descending right L5 nerve root in the right lateral recess. 4. No significant canal stenosis. Electronically Signed   By: Kristine Garbe M.D.   On: 12/28/2016 18:05   Mr Pelvis Wo Contrast  Result Date: 12/29/2016 CLINICAL DATA:  Bilateral buttock pain for 1.5 years. No known injury. EXAM: MRI PELVIS WITHOUT CONTRAST TECHNIQUE: Multiplanar multisequence MR imaging of the pelvis was performed. No intravenous contrast was administered. COMPARISON:  MRI of the pelvis 11/19/2014. FINDINGS: Mild degenerative change about the sacroiliac joints is again seen. There is some subchondral edema in the right sacrum adjacent to the SI joint. Minimal edema in the left sacrum adjacent to the SI joint appears improved compared to the prior study. Also seen is mild degenerative change about the hips tiny subchondral  cyst in the left acetabular roof is identified. There is no acute bony or joint abnormality. No avascular necrosis of the femoral heads. No worrisome bony lesion. There is no hip joint effusion. No evidence of bursitis is identified. Mild hamstring tendinosis on the left seen on the prior examination appears resolved. There is new mild tendinosis of the right hamstring origin. No tear. Musculature otherwise appears normal. Imaged intrapelvic contents demonstrate postoperative change of hysterectomy. Extensive diverticular disease of the imaged descending and  sigmoid colon is identified. IMPRESSION: No acute abnormality. Mild bilateral SI joint and hip degenerative disease bilaterally is not notably changed compared to the prior study. Mild tendinosis of the right hamstring origin without tear is new since the prior study. Tendinosis at the left hamstring origin seen on the prior MRI has resolved. Diverticulosis without diverticulitis. Electronically Signed   By: Inge Rise M.D.   On: 12/29/2016 09:11    Assessment & Plan:   There are no diagnoses linked to this encounter.   No orders of the defined types were placed in this encounter.    Follow-up: No follow-ups on file.  Walker Kehr, MD

## 2018-03-13 NOTE — Assessment & Plan Note (Addendum)
R lower chest  Rib X ray Tramadol

## 2018-03-16 NOTE — Assessment & Plan Note (Signed)
Here for medicare wellness/physical  Diet: heart healthy  Physical activity: not sedentary  Depression/mood screen: negative  Hearing: intact to whispered voice  Visual acuity: grossly normal, performs annual eye exam  ADLs: capable  Fall risk: none  Home safety: good  Cognitive evaluation: intact to orientation, naming, recall and repetition  EOL planning: adv directives, full code/ I agree  I have personally reviewed and have noted  1. The patient's medical, surgical and social history  2. Their use of alcohol, tobacco or illicit drugs  3. Their current medications and supplements  4. The patient's functional ability including ADL's, fall risks, home safety risks and hearing or visual impairment.  5. Diet and physical activities  6. Evidence for depression or mood disorders 7. The roster of all physicians providing medical care to patient - is listed in the Snapshot section of the chart and reviewed today.    Today patient counseled on age appropriate routine health concerns for screening and prevention, each reviewed and up to date or declined. Immunizations reviewed and up to date or declined. Labs ordered and reviewed. Risk factors for depression reviewed and negative. Hearing function and visual acuity are intact. ADLs screened and addressed as needed. Functional ability and level of safety reviewed and appropriate. Education, counseling and referrals performed based on assessed risks today. Patient provided with a copy of personalized plan for preventive services.   Sheila Hall has retired in June 2016, living in a motor home Flu shot

## 2018-03-22 DIAGNOSIS — R1084 Generalized abdominal pain: Secondary | ICD-10-CM | POA: Diagnosis not present

## 2018-03-22 DIAGNOSIS — K219 Gastro-esophageal reflux disease without esophagitis: Secondary | ICD-10-CM | POA: Diagnosis not present

## 2018-03-22 DIAGNOSIS — K5730 Diverticulosis of large intestine without perforation or abscess without bleeding: Secondary | ICD-10-CM | POA: Diagnosis not present

## 2018-03-22 DIAGNOSIS — Z885 Allergy status to narcotic agent status: Secondary | ICD-10-CM | POA: Diagnosis not present

## 2018-03-22 DIAGNOSIS — Z79899 Other long term (current) drug therapy: Secondary | ICD-10-CM | POA: Diagnosis not present

## 2018-04-09 ENCOUNTER — Other Ambulatory Visit: Payer: Self-pay

## 2018-04-09 DIAGNOSIS — M4316 Spondylolisthesis, lumbar region: Secondary | ICD-10-CM | POA: Diagnosis not present

## 2018-04-09 DIAGNOSIS — Z6825 Body mass index (BMI) 25.0-25.9, adult: Secondary | ICD-10-CM | POA: Diagnosis not present

## 2018-04-13 DIAGNOSIS — M4316 Spondylolisthesis, lumbar region: Secondary | ICD-10-CM | POA: Diagnosis not present

## 2018-04-13 DIAGNOSIS — M47816 Spondylosis without myelopathy or radiculopathy, lumbar region: Secondary | ICD-10-CM | POA: Diagnosis not present

## 2018-04-13 DIAGNOSIS — Z23 Encounter for immunization: Secondary | ICD-10-CM | POA: Diagnosis not present

## 2018-04-20 DIAGNOSIS — M5416 Radiculopathy, lumbar region: Secondary | ICD-10-CM | POA: Diagnosis not present

## 2018-04-21 DIAGNOSIS — Z1211 Encounter for screening for malignant neoplasm of colon: Secondary | ICD-10-CM | POA: Diagnosis not present

## 2018-04-21 DIAGNOSIS — R1032 Left lower quadrant pain: Secondary | ICD-10-CM | POA: Diagnosis not present

## 2018-04-21 DIAGNOSIS — Z86010 Personal history of colonic polyps: Secondary | ICD-10-CM | POA: Diagnosis not present

## 2018-04-21 DIAGNOSIS — K5730 Diverticulosis of large intestine without perforation or abscess without bleeding: Secondary | ICD-10-CM | POA: Diagnosis not present

## 2018-04-23 DIAGNOSIS — M5416 Radiculopathy, lumbar region: Secondary | ICD-10-CM | POA: Diagnosis not present

## 2018-04-29 DIAGNOSIS — M5416 Radiculopathy, lumbar region: Secondary | ICD-10-CM | POA: Diagnosis not present

## 2018-05-05 DIAGNOSIS — M4316 Spondylolisthesis, lumbar region: Secondary | ICD-10-CM | POA: Diagnosis not present

## 2018-05-07 DIAGNOSIS — H35371 Puckering of macula, right eye: Secondary | ICD-10-CM | POA: Diagnosis not present

## 2018-05-07 DIAGNOSIS — H5202 Hypermetropia, left eye: Secondary | ICD-10-CM | POA: Diagnosis not present

## 2018-05-07 DIAGNOSIS — H5211 Myopia, right eye: Secondary | ICD-10-CM | POA: Diagnosis not present

## 2018-05-07 DIAGNOSIS — H52223 Regular astigmatism, bilateral: Secondary | ICD-10-CM | POA: Diagnosis not present

## 2018-05-07 DIAGNOSIS — H2513 Age-related nuclear cataract, bilateral: Secondary | ICD-10-CM | POA: Diagnosis not present

## 2018-05-07 DIAGNOSIS — H524 Presbyopia: Secondary | ICD-10-CM | POA: Diagnosis not present

## 2018-05-08 DIAGNOSIS — D123 Benign neoplasm of transverse colon: Secondary | ICD-10-CM | POA: Diagnosis not present

## 2018-05-08 DIAGNOSIS — Z86010 Personal history of colonic polyps: Secondary | ICD-10-CM | POA: Diagnosis not present

## 2018-05-08 DIAGNOSIS — K5730 Diverticulosis of large intestine without perforation or abscess without bleeding: Secondary | ICD-10-CM | POA: Diagnosis not present

## 2018-05-08 DIAGNOSIS — K635 Polyp of colon: Secondary | ICD-10-CM | POA: Diagnosis not present

## 2018-05-08 DIAGNOSIS — R1032 Left lower quadrant pain: Secondary | ICD-10-CM | POA: Diagnosis not present

## 2018-05-08 DIAGNOSIS — D122 Benign neoplasm of ascending colon: Secondary | ICD-10-CM | POA: Diagnosis not present

## 2018-05-08 LAB — HM COLONOSCOPY

## 2018-05-12 DIAGNOSIS — M4316 Spondylolisthesis, lumbar region: Secondary | ICD-10-CM | POA: Diagnosis not present

## 2018-05-20 DIAGNOSIS — Z6825 Body mass index (BMI) 25.0-25.9, adult: Secondary | ICD-10-CM | POA: Diagnosis not present

## 2018-05-20 DIAGNOSIS — M5416 Radiculopathy, lumbar region: Secondary | ICD-10-CM | POA: Diagnosis not present

## 2018-05-20 DIAGNOSIS — I10 Essential (primary) hypertension: Secondary | ICD-10-CM | POA: Diagnosis not present

## 2018-05-20 DIAGNOSIS — M4316 Spondylolisthesis, lumbar region: Secondary | ICD-10-CM | POA: Diagnosis not present

## 2018-05-20 DIAGNOSIS — K5732 Diverticulitis of large intestine without perforation or abscess without bleeding: Secondary | ICD-10-CM | POA: Diagnosis not present

## 2018-05-26 DIAGNOSIS — Z9889 Other specified postprocedural states: Secondary | ICD-10-CM | POA: Diagnosis not present

## 2018-05-26 DIAGNOSIS — D329 Benign neoplasm of meninges, unspecified: Secondary | ICD-10-CM | POA: Diagnosis not present

## 2018-06-05 DIAGNOSIS — M5416 Radiculopathy, lumbar region: Secondary | ICD-10-CM | POA: Diagnosis not present

## 2018-06-05 DIAGNOSIS — Z79899 Other long term (current) drug therapy: Secondary | ICD-10-CM | POA: Diagnosis not present

## 2018-06-05 DIAGNOSIS — M4316 Spondylolisthesis, lumbar region: Secondary | ICD-10-CM | POA: Diagnosis not present

## 2018-06-05 DIAGNOSIS — I10 Essential (primary) hypertension: Secondary | ICD-10-CM | POA: Diagnosis not present

## 2018-06-12 DIAGNOSIS — M5416 Radiculopathy, lumbar region: Secondary | ICD-10-CM | POA: Diagnosis present

## 2018-06-12 DIAGNOSIS — I10 Essential (primary) hypertension: Secondary | ICD-10-CM | POA: Diagnosis present

## 2018-06-12 DIAGNOSIS — Z79899 Other long term (current) drug therapy: Secondary | ICD-10-CM | POA: Diagnosis not present

## 2018-06-12 DIAGNOSIS — M4316 Spondylolisthesis, lumbar region: Secondary | ICD-10-CM | POA: Diagnosis present

## 2018-07-16 DIAGNOSIS — Z981 Arthrodesis status: Secondary | ICD-10-CM | POA: Diagnosis not present

## 2018-07-16 DIAGNOSIS — M4316 Spondylolisthesis, lumbar region: Secondary | ICD-10-CM | POA: Diagnosis not present

## 2018-07-17 DIAGNOSIS — R600 Localized edema: Secondary | ICD-10-CM | POA: Diagnosis not present

## 2018-07-21 MED ORDER — ESTRADIOL 1 MG PO TABS: 1.0000 mg | ORAL_TABLET | Freq: Every day | ORAL | 2 refills | Status: DC

## 2018-07-22 DIAGNOSIS — Z23 Encounter for immunization: Secondary | ICD-10-CM | POA: Diagnosis not present

## 2018-08-20 DIAGNOSIS — M7701 Medial epicondylitis, right elbow: Secondary | ICD-10-CM | POA: Diagnosis not present

## 2018-08-20 DIAGNOSIS — M7121 Synovial cyst of popliteal space [Baker], right knee: Secondary | ICD-10-CM | POA: Diagnosis not present

## 2018-08-31 DIAGNOSIS — G5621 Lesion of ulnar nerve, right upper limb: Secondary | ICD-10-CM | POA: Diagnosis not present

## 2018-08-31 DIAGNOSIS — M1711 Unilateral primary osteoarthritis, right knee: Secondary | ICD-10-CM | POA: Diagnosis not present

## 2018-10-04 MED ORDER — ZOLPIDEM TARTRATE 10 MG PO TABS: 10.0000 mg | ORAL_TABLET | Freq: Every evening | ORAL | 1 refills | Status: DC | PRN

## 2018-11-04 DIAGNOSIS — Z7722 Contact with and (suspected) exposure to environmental tobacco smoke (acute) (chronic): Secondary | ICD-10-CM | POA: Diagnosis not present

## 2018-11-04 DIAGNOSIS — I824Z1 Acute embolism and thrombosis of unspecified deep veins of right distal lower extremity: Secondary | ICD-10-CM | POA: Diagnosis not present

## 2018-11-04 DIAGNOSIS — L03119 Cellulitis of unspecified part of limb: Secondary | ICD-10-CM | POA: Diagnosis not present

## 2018-11-04 DIAGNOSIS — R2241 Localized swelling, mass and lump, right lower limb: Secondary | ICD-10-CM | POA: Diagnosis not present

## 2018-11-04 DIAGNOSIS — I82431 Acute embolism and thrombosis of right popliteal vein: Secondary | ICD-10-CM | POA: Diagnosis not present

## 2018-11-04 DIAGNOSIS — I82491 Acute embolism and thrombosis of other specified deep vein of right lower extremity: Secondary | ICD-10-CM | POA: Diagnosis not present

## 2018-11-04 DIAGNOSIS — G629 Polyneuropathy, unspecified: Secondary | ICD-10-CM | POA: Diagnosis not present

## 2018-11-04 DIAGNOSIS — Z885 Allergy status to narcotic agent status: Secondary | ICD-10-CM | POA: Diagnosis not present

## 2018-11-04 DIAGNOSIS — M7989 Other specified soft tissue disorders: Secondary | ICD-10-CM | POA: Diagnosis not present

## 2018-11-18 DIAGNOSIS — M545 Low back pain: Secondary | ICD-10-CM | POA: Diagnosis not present

## 2018-11-18 DIAGNOSIS — I82431 Acute embolism and thrombosis of right popliteal vein: Secondary | ICD-10-CM | POA: Diagnosis not present

## 2018-11-18 DIAGNOSIS — Z6825 Body mass index (BMI) 25.0-25.9, adult: Secondary | ICD-10-CM | POA: Diagnosis not present

## 2018-11-18 DIAGNOSIS — N951 Menopausal and female climacteric states: Secondary | ICD-10-CM | POA: Diagnosis not present

## 2019-03-03 ENCOUNTER — Encounter: Payer: Self-pay | Admitting: Gastroenterology

## 2019-03-15 ENCOUNTER — Other Ambulatory Visit: Payer: Self-pay

## 2019-03-15 ENCOUNTER — Other Ambulatory Visit (INDEPENDENT_AMBULATORY_CARE_PROVIDER_SITE_OTHER): Payer: Medicare Other

## 2019-03-15 ENCOUNTER — Encounter: Payer: Self-pay | Admitting: Internal Medicine

## 2019-03-15 ENCOUNTER — Ambulatory Visit (INDEPENDENT_AMBULATORY_CARE_PROVIDER_SITE_OTHER): Payer: Medicare Other | Admitting: Internal Medicine

## 2019-03-15 VITALS — BP 142/82 | HR 67 | Temp 98.1°F | Ht 68.0 in | Wt 170.0 lb

## 2019-03-15 DIAGNOSIS — G8929 Other chronic pain: Secondary | ICD-10-CM

## 2019-03-15 DIAGNOSIS — R590 Localized enlarged lymph nodes: Secondary | ICD-10-CM | POA: Diagnosis not present

## 2019-03-15 DIAGNOSIS — E785 Hyperlipidemia, unspecified: Secondary | ICD-10-CM

## 2019-03-15 DIAGNOSIS — G4700 Insomnia, unspecified: Secondary | ICD-10-CM

## 2019-03-15 DIAGNOSIS — K5730 Diverticulosis of large intestine without perforation or abscess without bleeding: Secondary | ICD-10-CM

## 2019-03-15 DIAGNOSIS — M25561 Pain in right knee: Secondary | ICD-10-CM | POA: Diagnosis not present

## 2019-03-15 DIAGNOSIS — D329 Benign neoplasm of meninges, unspecified: Secondary | ICD-10-CM

## 2019-03-15 DIAGNOSIS — G43D0 Abdominal migraine, not intractable: Secondary | ICD-10-CM

## 2019-03-15 DIAGNOSIS — I82409 Acute embolism and thrombosis of unspecified deep veins of unspecified lower extremity: Secondary | ICD-10-CM | POA: Insufficient documentation

## 2019-03-15 DIAGNOSIS — I82461 Acute embolism and thrombosis of right calf muscular vein: Secondary | ICD-10-CM

## 2019-03-15 DIAGNOSIS — R7309 Other abnormal glucose: Secondary | ICD-10-CM | POA: Diagnosis not present

## 2019-03-15 DIAGNOSIS — M79604 Pain in right leg: Secondary | ICD-10-CM | POA: Diagnosis not present

## 2019-03-15 LAB — URINALYSIS
Bilirubin Urine: NEGATIVE
Hgb urine dipstick: NEGATIVE
Ketones, ur: NEGATIVE
Leukocytes,Ua: NEGATIVE
Nitrite: NEGATIVE
Specific Gravity, Urine: 1.015 (ref 1.000–1.030)
Total Protein, Urine: NEGATIVE
Urine Glucose: NEGATIVE
Urobilinogen, UA: 0.2 (ref 0.0–1.0)
pH: 5 (ref 5.0–8.0)

## 2019-03-15 LAB — CBC WITH DIFFERENTIAL/PLATELET
Basophils Absolute: 0.1 10*3/uL (ref 0.0–0.1)
Basophils Relative: 0.6 % (ref 0.0–3.0)
Eosinophils Absolute: 0.1 10*3/uL (ref 0.0–0.7)
Eosinophils Relative: 0.8 % (ref 0.0–5.0)
HCT: 40.8 % (ref 36.0–46.0)
Hemoglobin: 13.7 g/dL (ref 12.0–15.0)
Lymphocytes Relative: 31.4 % (ref 12.0–46.0)
Lymphs Abs: 2.9 10*3/uL (ref 0.7–4.0)
MCHC: 33.6 g/dL (ref 30.0–36.0)
MCV: 95.4 fl (ref 78.0–100.0)
Monocytes Absolute: 0.7 10*3/uL (ref 0.1–1.0)
Monocytes Relative: 7.6 % (ref 3.0–12.0)
Neutro Abs: 5.5 10*3/uL (ref 1.4–7.7)
Neutrophils Relative %: 59.6 % (ref 43.0–77.0)
Platelets: 392 10*3/uL (ref 150.0–400.0)
RBC: 4.27 Mil/uL (ref 3.87–5.11)
RDW: 12.6 % (ref 11.5–15.5)
WBC: 9.3 10*3/uL (ref 4.0–10.5)

## 2019-03-15 LAB — HEPATIC FUNCTION PANEL
ALT: 18 U/L (ref 0–35)
AST: 19 U/L (ref 0–37)
Albumin: 4.7 g/dL (ref 3.5–5.2)
Alkaline Phosphatase: 59 U/L (ref 39–117)
Bilirubin, Direct: 0.1 mg/dL (ref 0.0–0.3)
Total Bilirubin: 0.5 mg/dL (ref 0.2–1.2)
Total Protein: 7.6 g/dL (ref 6.0–8.3)

## 2019-03-15 LAB — LIPID PANEL
Cholesterol: 223 mg/dL — ABNORMAL HIGH (ref 0–200)
HDL: 65.6 mg/dL (ref >39.00)
LDL Cholesterol: 123 mg/dL — ABNORMAL HIGH (ref 0–99)
NonHDL: 157.33
Total CHOL/HDL Ratio: 3
Triglycerides: 172 mg/dL — ABNORMAL HIGH (ref 0.0–149.0)
VLDL: 34.4 mg/dL (ref 0.0–40.0)

## 2019-03-15 LAB — BASIC METABOLIC PANEL
BUN: 16 mg/dL (ref 6–23)
CO2: 28 mEq/L (ref 19–32)
Calcium: 9.6 mg/dL (ref 8.4–10.5)
Chloride: 101 mEq/L (ref 96–112)
Creatinine, Ser: 0.79 mg/dL (ref 0.40–1.20)
GFR: 72.04 mL/min (ref >60.00)
Glucose, Bld: 91 mg/dL (ref 70–99)
Potassium: 4.4 mEq/L (ref 3.5–5.1)
Sodium: 138 mEq/L (ref 135–145)

## 2019-03-15 LAB — TSH: TSH: 1.73 u[IU]/mL (ref 0.35–4.50)

## 2019-03-15 LAB — HEMOGLOBIN A1C: Hgb A1c MFr Bld: 5.8 % (ref 4.6–6.5)

## 2019-03-15 MED ORDER — TRAMADOL-ACETAMINOPHEN 37.5-325 MG PO TABS: 1.0000 | ORAL_TABLET | Freq: Three times a day (TID) | ORAL | 0 refills | Status: DC | PRN

## 2019-03-15 MED ORDER — ZOLPIDEM TARTRATE 10 MG PO TABS: 10.0000 mg | ORAL_TABLET | Freq: Every evening | ORAL | 1 refills | Status: DC | PRN

## 2019-03-15 MED ORDER — OMEPRAZOLE 40 MG PO CPDR: 40.0000 mg | DELAYED_RELEASE_CAPSULE | Freq: Every day | ORAL | 3 refills | Status: DC

## 2019-03-15 MED ORDER — GABAPENTIN 300 MG PO CAPS: 300.0000 mg | ORAL_CAPSULE | Freq: Every day | ORAL | 3 refills | Status: DC

## 2019-03-15 NOTE — Assessment & Plan Note (Signed)
Doing well 

## 2019-03-15 NOTE — Assessment & Plan Note (Addendum)
DVT on Estrace - d/c'd Eliquis x 3 mo

## 2019-03-15 NOTE — Progress Notes (Signed)
Subjective:  Patient ID: Sheila Hall, female    DOB: 10/14/1950  Age: 68 y.o. MRN: [id number]  CC: No chief complaint on file.   HPI Sheila Hall presents for LBP - pain resolved after back surgery in 2019, GERD, menopause, insomnia f/u. Pt had diverticulitis x 4 in the past 12 mo. H/o DVT on Estrace 10/2018.  Sheila Hall is complaining of right knee pain.  She requesting a steroid injection.  Outpatient Medications Prior to Visit  Medication Sig Dispense Refill  . docusate sodium (COLACE) 100 MG capsule Take 100 mg by mouth 2 (two) times daily.    Marland Kitchen estradiol (ESTRACE) 1 MG tablet Take 1 tablet (1 mg total) by mouth daily. 90 tablet 2  . gabapentin (NEURONTIN) 300 MG capsule Take 1 capsule (300 mg total) by mouth at bedtime. 90 capsule 3  . omeprazole (PRILOSEC) 40 MG capsule Take 1 capsule (40 mg total) by mouth daily. 90 capsule 3  . traMADol-acetaminophen (ULTRACET) 37.5-325 MG tablet Take 1 tablet by mouth every 8 (eight) hours as needed. 90 tablet 3  . zolpidem (AMBIEN) 10 MG tablet Take 1 tablet (10 mg total) by mouth at bedtime as needed for sleep. 90 tablet 1   Facility-Administered Medications Prior to Visit  Medication Dose Route Frequency Provider Last Rate Last Dose  . DOBUTamine (DOBUTREX) 1,000 mcg/mL in sodium chloride 0.9 % 150 mL infusion  40 mcg/kg Intravenous Continuous Plotnikov, Evie Lacks, MD   40 mcg/kg/min at 06/09/12 1530    ROS: Review of Systems  Constitutional: Negative for activity change, appetite change, chills, fatigue and unexpected weight change.  HENT: Negative for congestion, mouth sores and sinus pressure.   Eyes: Negative for visual disturbance.  Respiratory: Negative for cough and chest tightness.   Gastrointestinal: Positive for abdominal pain. Negative for nausea.  Genitourinary: Negative for difficulty urinating, frequency and vaginal pain.  Musculoskeletal: Negative for back pain and gait problem.  Skin: Negative for pallor and  rash.  Neurological: Negative for dizziness, tremors, weakness, numbness and headaches.  Psychiatric/Behavioral: Negative for confusion and sleep disturbance.    Objective:  BP (!) 142/82 (BP Location: Left Arm, Patient Position: Sitting, Cuff Size: Normal)   Pulse 67   Temp 98.1 F (36.7 C) (Oral)   Ht 5\' 8"  (1.727 m)   Wt 170 lb (77.1 kg)   SpO2 98%   BMI 25.85 kg/m   BP Readings from Last 3 Encounters:  03/15/19 (!) 142/82  03/13/18 (!) 164/90  07/07/17 122/74    Wt Readings from Last 3 Encounters:  03/15/19 170 lb (77.1 kg)  03/13/18 167 lb (75.8 kg)  07/07/17 173 lb (78.5 kg)    Physical Exam Constitutional:      General: She is not in acute distress.    Appearance: She is well-developed.  HENT:     Head: Normocephalic.     Right Ear: External ear normal.     Left Ear: External ear normal.     Nose: Nose normal.  Eyes:     General:        Right eye: No discharge.        Left eye: No discharge.     Conjunctiva/sclera: Conjunctivae normal.     Pupils: Pupils are equal, round, and reactive to light.  Neck:     Musculoskeletal: Normal range of motion and neck supple.     Thyroid: No thyromegaly.     Vascular: No JVD.     Trachea: No tracheal deviation.  Cardiovascular:     Rate and Rhythm: Normal rate and regular rhythm.     Heart sounds: Normal heart sounds.  Pulmonary:     Effort: No respiratory distress.     Breath sounds: No stridor. No wheezing.  Abdominal:     General: Bowel sounds are normal. There is no distension.     Palpations: Abdomen is soft. There is no mass.     Tenderness: There is abdominal tenderness. There is no guarding or rebound.  Musculoskeletal:        General: No tenderness.  Lymphadenopathy:     Cervical: No cervical adenopathy.  Skin:    Findings: No erythema or rash.  Neurological:     Cranial Nerves: No cranial nerve deficit.     Motor: No abnormal muscle tone.     Coordination: Coordination normal.     Deep Tendon  Reflexes: Reflexes normal.  Psychiatric:        Behavior: Behavior normal.        Thought Content: Thought content normal.        Judgment: Judgment normal.   LLQ - sensitive.  Right knee with arthritis deformities.  Painful range of motion   Procedure Note :     Procedure : Joint Injection, R  knee   Indication:  Joint osteoarthritis with refractory  chronic pain.   Risks including unsuccessful procedure , bleeding, infection, bruising, skin atrophy, "steroid flare-up" and others were explained to the patient in detail as well as the benefits. Informed consent was obtained and signed.   Tthe patient was placed in a comfortable position. Lateral approach was used. Skin was prepped with Betadine and alcohol  and anesthetized a cooling spray. Then, a 5 cc syringe with a 1.5 inch long 25-gauge needle was used for a joint injection.. The needle was advanced  Into the knee joint cavity. I aspirated a small amount of intra-articular fluid to confirm correct placement of the needle and injected the joint with 5 mL of 2% lidocaine and 40 mg of Depo-Medrol .  Band-Aid was applied.   Tolerated well. Complications: None. Good pain relief following the procedure.   Postprocedure instructions :    A Band-Aid should be left on for 12 hours. Injection therapy is not a cure itself. It is used in conjunction with other modalities. You can use nonsteroidal anti-inflammatories like ibuprofen , hot and cold compresses. Rest is recommended in the next 24 hours. You need to report immediately  if fever, chills or any signs of infection develop.   Lab Results  Component Value Date   WBC 8.1 07/07/2017   HGB 14.3 07/07/2017   HCT 43.9 07/07/2017   PLT 335.0 07/07/2017   GLUCOSE 96 07/07/2017   CHOL 207 (H) 07/07/2017   TRIG 149.0 07/07/2017   HDL 65.90 07/07/2017   LDLDIRECT 106.2 09/16/2012   LDLCALC 111 (H) 07/07/2017   ALT 15 07/07/2017   AST 16 07/07/2017   NA 141 07/07/2017   K 4.6 07/07/2017    CL 103 07/07/2017   CREATININE 0.71 07/07/2017   BUN 14 07/07/2017   CO2 27 07/07/2017   TSH 1.77 07/07/2017   HGBA1C 5.6 08/05/2007    Dg Chest 2 View  Result Date: 03/13/2018 CLINICAL DATA:  Right chest/posterior lower rib pain. EXAM: CHEST - 2 VIEW COMPARISON:  None. FINDINGS: The cardiomediastinal silhouette is within normal limits. The lungs are well inflated with mild biapical pleural thickening and minimal left basilar scarring noted. No focal airspace consolidation,  edema, pleural effusion, or pneumothorax is identified. No acute osseous abnormality is identified, with detailed rib assessment deferred to separate radiographs. IMPRESSION: No active cardiopulmonary disease. Electronically Signed   By: Logan Bores M.D.   On: 03/13/2018 17:09   Dg Ribs Unilateral Right  Result Date: 03/13/2018 CLINICAL DATA:  Right chest/posterior lower rib pain. EXAM: RIGHT RIBS - 2 VIEW COMPARISON:  None. FINDINGS: No fracture or other bone lesions are seen involving the right ribs. IMPRESSION: Negative. Electronically Signed   By: Logan Bores M.D.   On: 03/13/2018 17:10    Assessment & Plan:   There are no diagnoses linked to this encounter.   No orders of the defined types were placed in this encounter.    Follow-up: No follow-ups on file.  Walker Kehr, MD

## 2019-03-15 NOTE — Patient Instructions (Signed)
If you have medicare related insurance (such as traditional Medicare, Blue Cross Medicare, United [employer] Medicare, or similar), Please make an appointment at the scheduling desk with Jill, the Wellness Health Coach, for your Wellness visit in this office, which is a benefit with your insurance.  

## 2019-03-15 NOTE — Assessment & Plan Note (Signed)
Zolpidem

## 2019-03-15 NOTE — Assessment & Plan Note (Signed)
Knee osteoarthritis flareup.  The patient requested injection.  See procedure

## 2019-03-15 NOTE — Assessment & Plan Note (Signed)
F/u w/NS

## 2019-03-15 NOTE — Assessment & Plan Note (Signed)
Pt had diverticulitis x 4 in the past 12 mo.

## 2019-03-16 ENCOUNTER — Telehealth: Payer: Self-pay | Admitting: Internal Medicine

## 2019-03-16 NOTE — Telephone Encounter (Signed)
For pt'sUltracet need some information on why the pt need this. Please advise

## 2019-03-16 NOTE — Telephone Encounter (Signed)
DX code given

## 2019-03-18 ENCOUNTER — Other Ambulatory Visit: Payer: Self-pay | Admitting: Internal Medicine

## 2019-03-18 DIAGNOSIS — E785 Hyperlipidemia, unspecified: Secondary | ICD-10-CM

## 2019-03-21 IMAGING — CT CT NECK W/ CM
5 of 6 series · 14 of 33 positions shown, 16 images · IV contrast (ISOVUE 300)
Comparison: None.

CLINICAL DATA: Right throat neck pain for 3 months. Evaluate for
mass.

EXAM:
CT NECK WITH CONTRAST
TECHNIQUE: Multidetector CT imaging of the neck was performed using the
standard protocol following the bolus administration of intravenous
contrast.
CONTRAST:  75mL ZWQL0B-S00 IOPAMIDOL (ZWQL0B-S00) INJECTION 61%

[Series 3: neck 2.0 i31s 2 · axial · 0.41mm/px · z∈[-155,-81]mm · 2 of 113 slices shown, 3 images]
[im 38/113  soft-tissue]
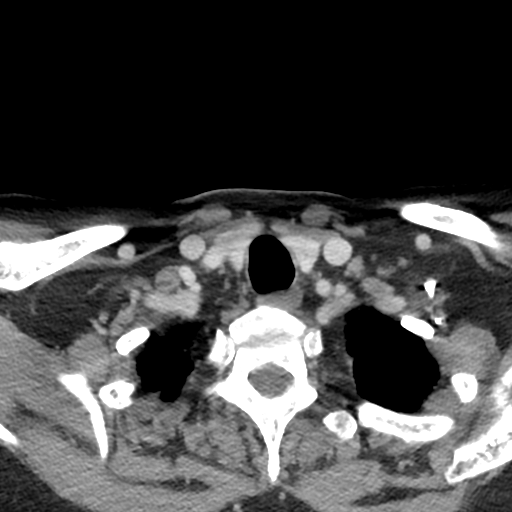
[im 38/113  bone]
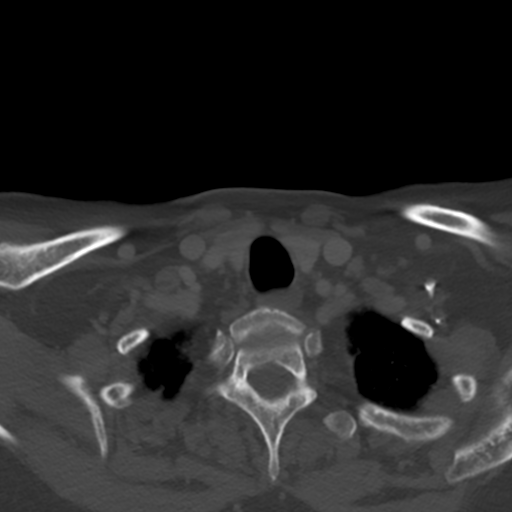
[im 75/113  bone]
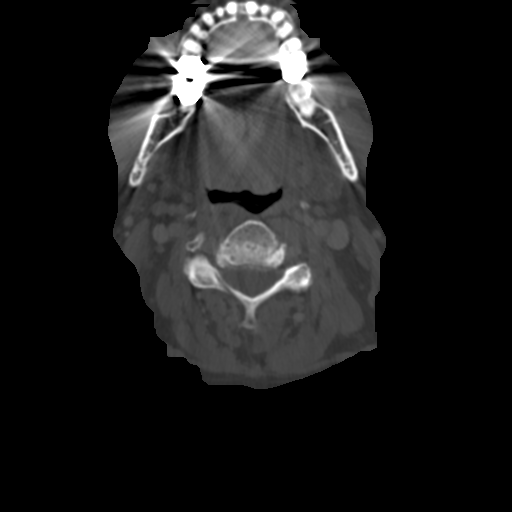

[Series 6: bone · axial · 0.41mm/px · z∈[-155,-81]mm · 2 of 113 slices shown]
[im 38/113  bone]
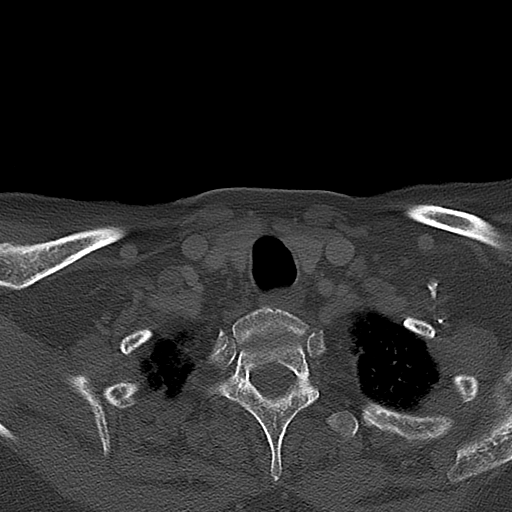
[im 75/113  bone]
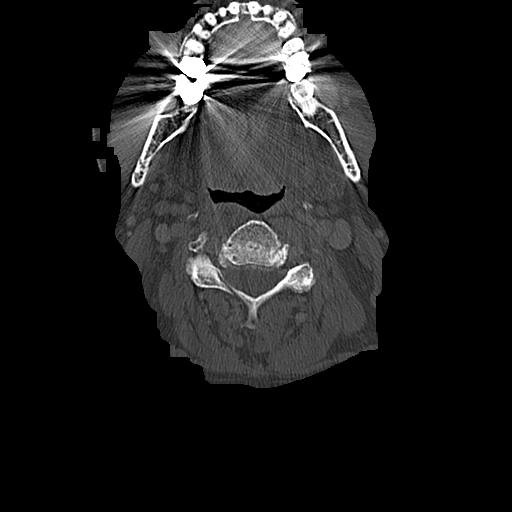

[Series 7: coronal st · coronal · 0.46mm/px · 3 of 108 slices shown]
[im 24/108  bone]
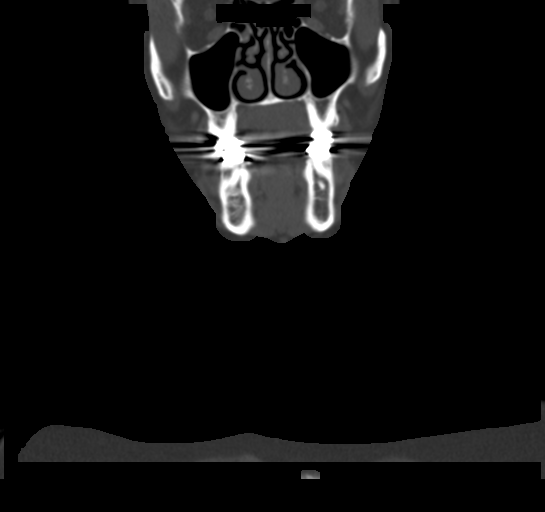
[im 44/108  bone]
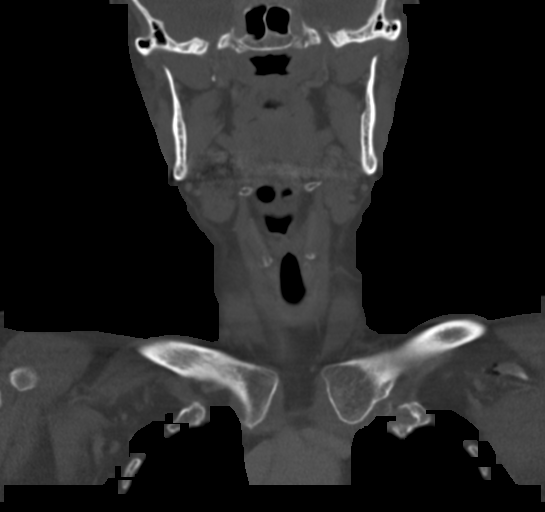
[im 64/108  bone]
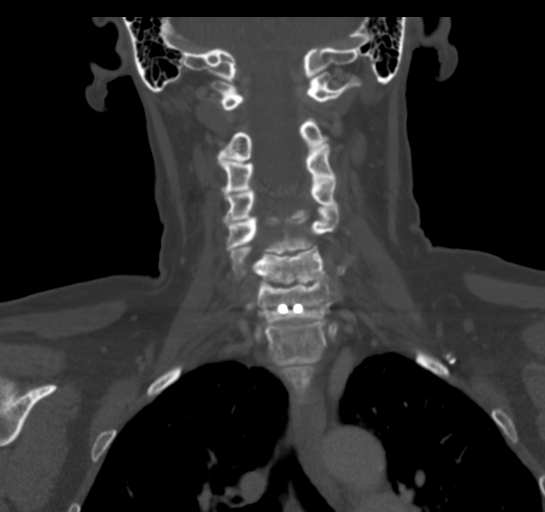

[Series 8: sagittal st · sagittal · 0.44mm/px · 5 of 137 slices shown, 6 images]
[im 46/137  bone]
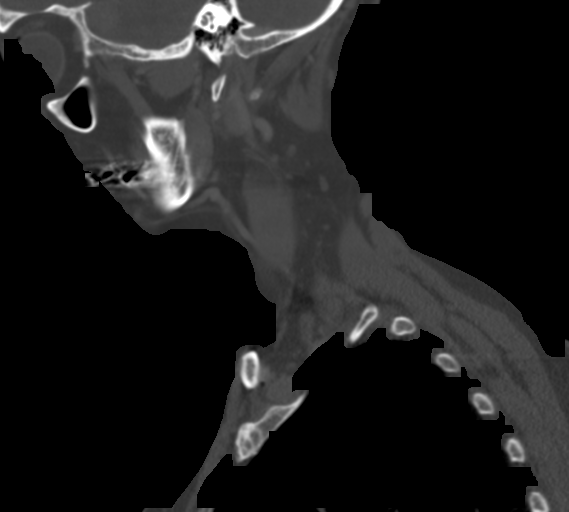
[im 57/137  bone]
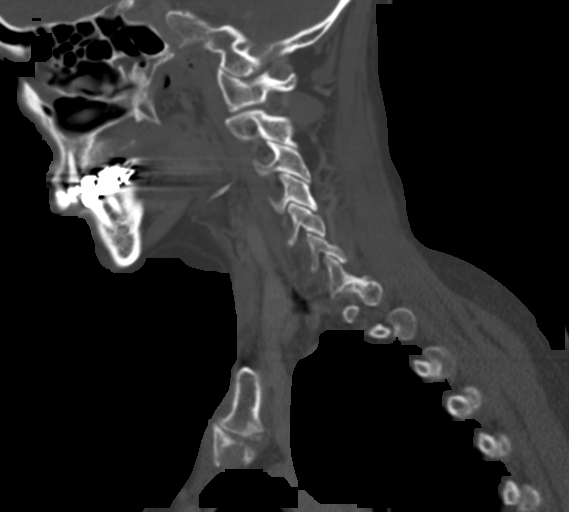
[im 69/137  soft-tissue]
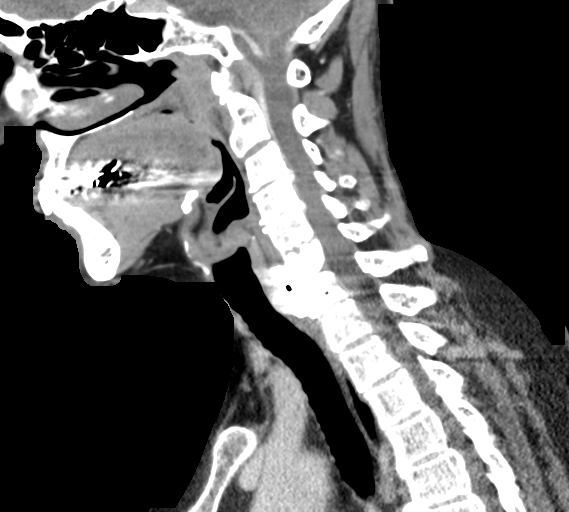
[im 69/137  bone]
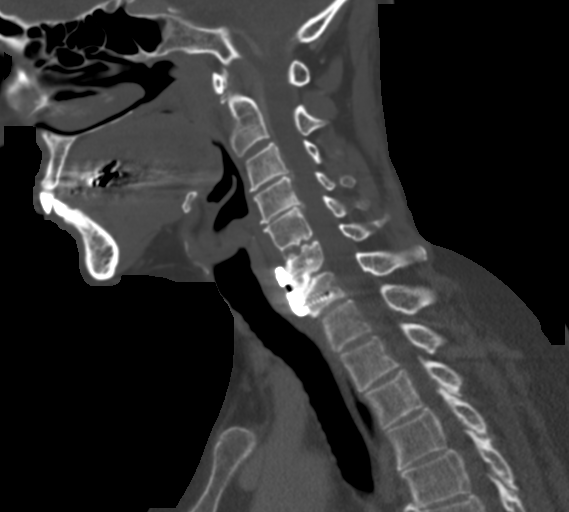
[im 80/137  bone]
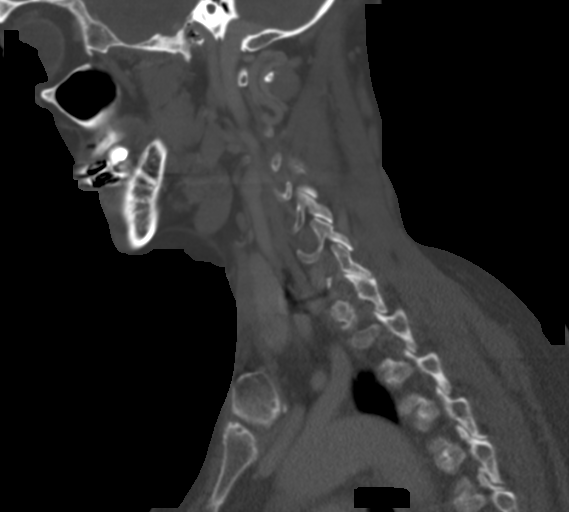
[im 91/137  bone]
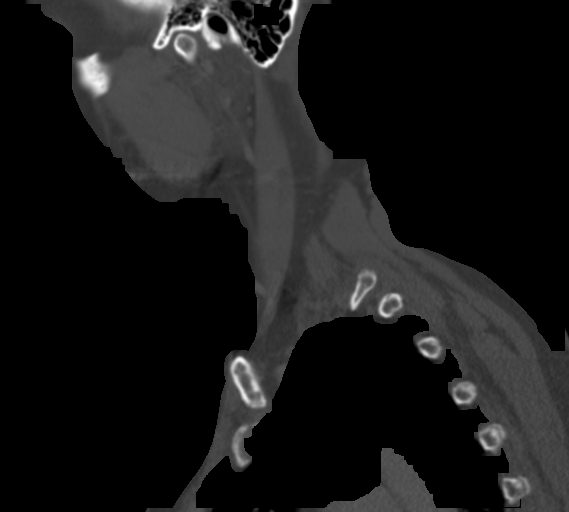

[Series 9: orthogonal · axial · 0.39mm/px · z∈[-192,-122]mm · 2 of 115 slices shown]
[im 39/115  bone]
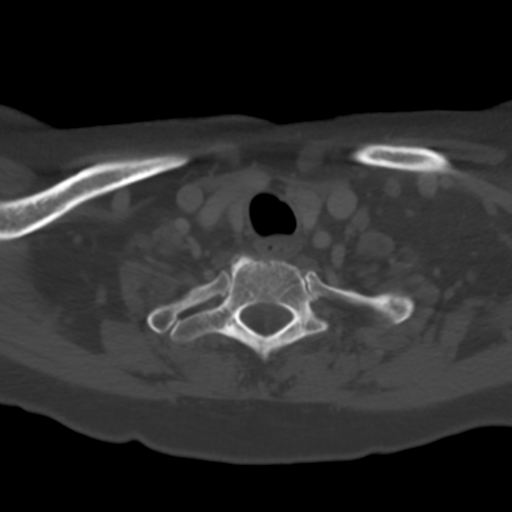
[im 77/115  bone]
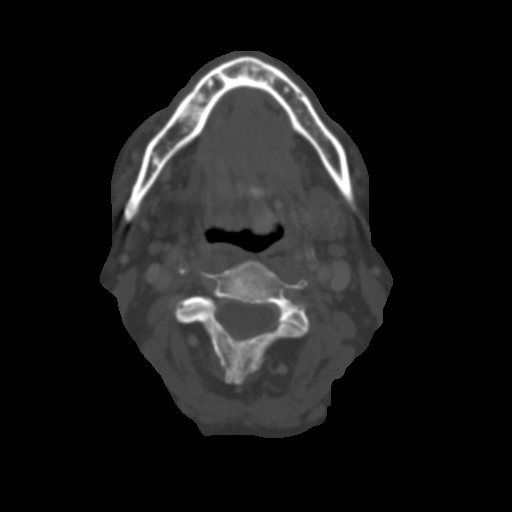

[14 of 33 positions shown; findings below may reference images not displayed]

FINDINGS: Pharynx and larynx: No evidence of mass or swelling.

Salivary glands: No inflammation, mass, or stone.

Thyroid: 4 mm nodule in the left thyroid, below size threshold for
sonographic follow-up for consensus recommendations.

Lymph nodes: None enlarged or abnormal density.

Vascular: Negative.  No evidence of inflammation or dissection.

Limited intracranial: Enhancing up to 18 mm mass in the right middle
cranial fossa, extra-axial. Previous imaging reviewed. This is
consistent with a meningioma.

Visualized orbits: Negative

Mastoids and visualized paranasal sinuses: Clear

Skeleton: C6-7 ACDF with plate. No acute or aggressive finding. No
findings at the skullbase foramina.

Upper chest: Negative
IMPRESSION: 1. No explanation for symptoms. No adenopathy or evidence of neck
mass.
2. 18 mm meningioma in the right middle cranial fossa.

## 2019-03-30 MED ORDER — TRAMADOL-ACETAMINOPHEN 37.5-325 MG PO TABS: 1.0000 | ORAL_TABLET | Freq: Three times a day (TID) | ORAL | 1 refills | Status: DC | PRN

## 2019-04-06 ENCOUNTER — Other Ambulatory Visit: Payer: Self-pay | Admitting: Internal Medicine

## 2019-04-06 MED ORDER — TRAMADOL-ACETAMINOPHEN 37.5-325 MG PO TABS: 1.0000 | ORAL_TABLET | Freq: Three times a day (TID) | ORAL | 1 refills | Status: AC | PRN

## 2019-04-08 ENCOUNTER — Other Ambulatory Visit: Payer: Self-pay | Admitting: Internal Medicine

## 2019-05-14 ENCOUNTER — Other Ambulatory Visit: Payer: Self-pay

## 2019-05-14 ENCOUNTER — Ambulatory Visit (INDEPENDENT_AMBULATORY_CARE_PROVIDER_SITE_OTHER)
Admission: RE | Admit: 2019-05-14 | Discharge: 2019-05-14 | Disposition: A | Discharge status: Home / Self Care | Payer: Self-pay | Source: Ambulatory Visit | Attending: Internal Medicine | Admitting: Internal Medicine

## 2019-05-14 DIAGNOSIS — Z803 Family history of malignant neoplasm of breast: Secondary | ICD-10-CM | POA: Diagnosis not present

## 2019-05-14 DIAGNOSIS — Z1231 Encounter for screening mammogram for malignant neoplasm of breast: Secondary | ICD-10-CM | POA: Diagnosis not present

## 2019-05-14 DIAGNOSIS — E785 Hyperlipidemia, unspecified: Secondary | ICD-10-CM

## 2019-05-14 LAB — HM MAMMOGRAPHY

## 2019-05-18 ENCOUNTER — Encounter: Payer: Self-pay | Admitting: Internal Medicine

## 2019-05-25 DIAGNOSIS — H35373 Puckering of macula, bilateral: Secondary | ICD-10-CM | POA: Diagnosis not present

## 2019-05-25 DIAGNOSIS — H52223 Regular astigmatism, bilateral: Secondary | ICD-10-CM | POA: Diagnosis not present

## 2019-05-25 DIAGNOSIS — Z23 Encounter for immunization: Secondary | ICD-10-CM | POA: Diagnosis not present

## 2019-05-25 DIAGNOSIS — H5211 Myopia, right eye: Secondary | ICD-10-CM | POA: Diagnosis not present

## 2019-05-25 DIAGNOSIS — H5202 Hypermetropia, left eye: Secondary | ICD-10-CM | POA: Diagnosis not present

## 2019-05-25 DIAGNOSIS — H524 Presbyopia: Secondary | ICD-10-CM | POA: Diagnosis not present

## 2019-05-25 DIAGNOSIS — H2513 Age-related nuclear cataract, bilateral: Secondary | ICD-10-CM | POA: Diagnosis not present

## 2019-07-15 DIAGNOSIS — M1712 Unilateral primary osteoarthritis, left knee: Secondary | ICD-10-CM | POA: Diagnosis not present

## 2019-07-15 DIAGNOSIS — M1711 Unilateral primary osteoarthritis, right knee: Secondary | ICD-10-CM | POA: Diagnosis not present

## 2019-08-04 DIAGNOSIS — L820 Inflamed seborrheic keratosis: Secondary | ICD-10-CM | POA: Diagnosis not present

## 2019-08-04 DIAGNOSIS — L538 Other specified erythematous conditions: Secondary | ICD-10-CM | POA: Diagnosis not present

## 2019-08-04 DIAGNOSIS — L309 Dermatitis, unspecified: Secondary | ICD-10-CM | POA: Diagnosis not present

## 2019-08-04 DIAGNOSIS — Z789 Other specified health status: Secondary | ICD-10-CM | POA: Diagnosis not present

## 2019-08-04 DIAGNOSIS — L298 Other pruritus: Secondary | ICD-10-CM | POA: Diagnosis not present

## 2019-08-04 DIAGNOSIS — L814 Other melanin hyperpigmentation: Secondary | ICD-10-CM | POA: Diagnosis not present

## 2019-08-04 DIAGNOSIS — L570 Actinic keratosis: Secondary | ICD-10-CM | POA: Diagnosis not present

## 2019-08-04 DIAGNOSIS — L821 Other seborrheic keratosis: Secondary | ICD-10-CM | POA: Diagnosis not present

## 2019-08-04 DIAGNOSIS — D2271 Melanocytic nevi of right lower limb, including hip: Secondary | ICD-10-CM | POA: Diagnosis not present

## 2019-08-04 DIAGNOSIS — D225 Melanocytic nevi of trunk: Secondary | ICD-10-CM | POA: Diagnosis not present

## 2019-08-04 DIAGNOSIS — D485 Neoplasm of uncertain behavior of skin: Secondary | ICD-10-CM | POA: Diagnosis not present

## 2019-09-06 DIAGNOSIS — U071 COVID-19: Secondary | ICD-10-CM | POA: Diagnosis not present

## 2019-09-13 DIAGNOSIS — U071 COVID-19: Secondary | ICD-10-CM | POA: Diagnosis not present

## 2019-09-13 DIAGNOSIS — Z23 Encounter for immunization: Secondary | ICD-10-CM | POA: Diagnosis not present

## 2019-09-19 ENCOUNTER — Other Ambulatory Visit: Payer: Self-pay | Admitting: Internal Medicine

## 2019-09-19 MED ORDER — ZOLPIDEM TARTRATE 10 MG PO TABS: 10.0000 mg | ORAL_TABLET | Freq: Every evening | ORAL | 1 refills | Status: DC | PRN

## 2019-10-20 DIAGNOSIS — L905 Scar conditions and fibrosis of skin: Secondary | ICD-10-CM | POA: Diagnosis not present

## 2019-10-20 DIAGNOSIS — L538 Other specified erythematous conditions: Secondary | ICD-10-CM | POA: Diagnosis not present

## 2019-10-20 DIAGNOSIS — D2371 Other benign neoplasm of skin of right lower limb, including hip: Secondary | ICD-10-CM | POA: Diagnosis not present

## 2019-10-20 DIAGNOSIS — R208 Other disturbances of skin sensation: Secondary | ICD-10-CM | POA: Diagnosis not present

## 2019-10-28 DIAGNOSIS — R350 Frequency of micturition: Secondary | ICD-10-CM | POA: Diagnosis not present

## 2019-10-28 DIAGNOSIS — R3914 Feeling of incomplete bladder emptying: Secondary | ICD-10-CM | POA: Diagnosis not present

## 2019-10-28 DIAGNOSIS — Z1231 Encounter for screening mammogram for malignant neoplasm of breast: Secondary | ICD-10-CM | POA: Diagnosis not present

## 2019-10-28 DIAGNOSIS — Z1331 Encounter for screening for depression: Secondary | ICD-10-CM | POA: Diagnosis not present

## 2019-10-28 DIAGNOSIS — Z6825 Body mass index (BMI) 25.0-25.9, adult: Secondary | ICD-10-CM | POA: Diagnosis not present

## 2019-11-02 DIAGNOSIS — R3914 Feeling of incomplete bladder emptying: Secondary | ICD-10-CM | POA: Diagnosis not present

## 2019-11-03 DIAGNOSIS — Z4802 Encounter for removal of sutures: Secondary | ICD-10-CM | POA: Diagnosis not present

## 2019-11-15 DIAGNOSIS — M1712 Unilateral primary osteoarthritis, left knee: Secondary | ICD-10-CM | POA: Diagnosis not present

## 2019-11-15 DIAGNOSIS — M1711 Unilateral primary osteoarthritis, right knee: Secondary | ICD-10-CM | POA: Diagnosis not present

## 2019-11-30 DIAGNOSIS — R109 Unspecified abdominal pain: Secondary | ICD-10-CM | POA: Diagnosis not present

## 2019-12-03 DIAGNOSIS — R109 Unspecified abdominal pain: Secondary | ICD-10-CM | POA: Diagnosis not present

## 2020-02-22 DIAGNOSIS — J0100 Acute maxillary sinusitis, unspecified: Secondary | ICD-10-CM | POA: Diagnosis not present

## 2020-02-28 ENCOUNTER — Other Ambulatory Visit: Payer: Self-pay | Admitting: *Deleted

## 2020-02-28 MED ORDER — OMEPRAZOLE 40 MG PO CPDR: 40.0000 mg | DELAYED_RELEASE_CAPSULE | Freq: Every day | ORAL | 0 refills | Status: DC

## 2020-04-03 ENCOUNTER — Encounter: Payer: Medicare Other | Admitting: Internal Medicine

## 2020-04-10 ENCOUNTER — Other Ambulatory Visit: Payer: Self-pay

## 2020-04-10 ENCOUNTER — Encounter: Payer: Self-pay | Admitting: Internal Medicine

## 2020-04-10 ENCOUNTER — Ambulatory Visit (INDEPENDENT_AMBULATORY_CARE_PROVIDER_SITE_OTHER): Payer: Medicare Other | Admitting: Internal Medicine

## 2020-04-10 ENCOUNTER — Other Ambulatory Visit: Payer: Self-pay | Admitting: Internal Medicine

## 2020-04-10 VITALS — BP 158/82 | HR 56 | Temp 98.4°F | Ht 68.0 in | Wt 163.0 lb

## 2020-04-10 DIAGNOSIS — R0789 Other chest pain: Secondary | ICD-10-CM | POA: Diagnosis not present

## 2020-04-10 DIAGNOSIS — G4700 Insomnia, unspecified: Secondary | ICD-10-CM | POA: Diagnosis not present

## 2020-04-10 DIAGNOSIS — T148XXA Other injury of unspecified body region, initial encounter: Secondary | ICD-10-CM | POA: Diagnosis not present

## 2020-04-10 LAB — COMPLETE METABOLIC PANEL WITH GFR
AG Ratio: 1.7 (calc) (ref 1.0–2.5)
ALT: 19 U/L (ref 6–29)
AST: 17 U/L (ref 10–35)
Albumin: 4.5 g/dL (ref 3.6–5.1)
Alkaline phosphatase (APISO): 55 U/L (ref 37–153)
BUN: 19 mg/dL (ref 7–25)
CO2: 25 mmol/L (ref 20–32)
Calcium: 9.9 mg/dL (ref 8.6–10.4)
Chloride: 103 mmol/L (ref 98–110)
Creat: 0.82 mg/dL (ref 0.60–0.93)
GFR, Est African American: 84 mL/min/{1.73_m2} (ref >=60)
GFR, Est Non African American: 72 mL/min/{1.73_m2} (ref >=60)
Globulin: 2.6 g/dL (calc) (ref 1.9–3.7)
Glucose, Bld: 91 mg/dL (ref 65–99)
Potassium: 4.5 mmol/L (ref 3.5–5.3)
Sodium: 139 mmol/L (ref 135–146)
Total Bilirubin: 0.4 mg/dL (ref 0.2–1.2)
Total Protein: 7.1 g/dL (ref 6.1–8.1)

## 2020-04-10 MED ORDER — ZOLPIDEM TARTRATE 10 MG PO TABS: 10.0000 mg | ORAL_TABLET | Freq: Every evening | ORAL | 1 refills | Status: AC | PRN

## 2020-04-10 MED ORDER — LIDOCAINE 5 % EX PTCH: 1.0000 | MEDICATED_PATCH | CUTANEOUS | 1 refills | Status: DC

## 2020-04-10 MED ORDER — GABAPENTIN 300 MG PO CAPS: 300.0000 mg | ORAL_CAPSULE | Freq: Every day | ORAL | 3 refills | Status: DC

## 2020-04-10 NOTE — Assessment & Plan Note (Signed)
Chronic  Zolpidem prn  Potential benefits of a long term benzodiazepines  use as well as potential risks  and complications were explained to the patient and were aknowledged. 

## 2020-04-10 NOTE — Assessment & Plan Note (Signed)
Arnica cream for bruises Labs

## 2020-04-10 NOTE — Patient Instructions (Signed)
Arnica cream for bruises 

## 2020-04-10 NOTE — Progress Notes (Signed)
Subjective:  Patient ID: Sheila Hall, female    DOB: May 06, 1951  Age: 69 y.o. MRN: [id number]  CC: No chief complaint on file.   HPI Sheila Hall presents for insomnia, GERD C/o R CP at the tip of the R shoulder blade x 1 week  Outpatient Medications Prior to Visit  Medication Sig Dispense Refill  . docusate sodium (COLACE) 100 MG capsule Take 100 mg by mouth 2 (two) times daily.    Marland Kitchen omeprazole (PRILOSEC) 40 MG capsule Take 1 capsule (40 mg total) by mouth daily. 90 capsule 0  . traMADol-acetaminophen (ULTRACET) 37.5-325 MG tablet Take 1 tablet by mouth every 8 (eight) hours as needed for severe pain. 90 tablet 1  . gabapentin (NEURONTIN) 300 MG capsule Take 1 capsule (300 mg total) by mouth at bedtime. 90 capsule 3  . zolpidem (AMBIEN) 10 MG tablet Take 1 tablet (10 mg total) by mouth at bedtime as needed for sleep. 90 tablet 1  . estradiol (ESTRACE) 1 MG tablet Take 1 tablet (1 mg total) by mouth daily. 90 tablet 2   Facility-Administered Medications Prior to Visit  Medication Dose Route Frequency Provider Last Rate Last Admin  . DOBUTamine (DOBUTREX) 1,000 mcg/mL in sodium chloride 0.9 % 150 mL infusion  40 mcg/kg Intravenous Continuous Mckinnon Glick, Evie Lacks, MD   40 mcg/kg/min at 06/09/12 1530    ROS: Review of Systems  Constitutional: Negative for activity change, appetite change, chills, fatigue and unexpected weight change.  HENT: Negative for congestion, mouth sores and sinus pressure.   Eyes: Negative for visual disturbance.  Respiratory: Negative for cough and chest tightness.   Cardiovascular: Positive for chest pain.  Gastrointestinal: Negative for abdominal pain and nausea.  Genitourinary: Negative for difficulty urinating, frequency and vaginal pain.  Musculoskeletal: Negative for back pain and gait problem.  Skin: Negative for pallor and rash.  Neurological: Negative for dizziness, tremors, weakness, numbness and headaches.  Hematological:  Bruises/bleeds easily.  Psychiatric/Behavioral: Negative for confusion and sleep disturbance.    Objective:  BP (!) 158/82 (BP Location: Left Arm, Patient Position: Sitting, Cuff Size: Normal)   Pulse (!) 56   Temp 98.4 F (36.9 C) (Oral)   Ht 5\' 8"  (1.727 m)   Wt 163 lb (73.9 kg)   SpO2 99%   BMI 24.78 kg/m   BP Readings from Last 3 Encounters:  04/10/20 (!) 158/82  03/15/19 (!) 142/82  03/13/18 (!) 164/90    Wt Readings from Last 3 Encounters:  04/10/20 163 lb (73.9 kg)  03/15/19 170 lb (77.1 kg)  03/13/18 167 lb (75.8 kg)    Physical Exam Constitutional:      General: She is not in acute distress.    Appearance: She is well-developed.  HENT:     Head: Normocephalic.     Right Ear: External ear normal.     Left Ear: External ear normal.     Nose: Nose normal.  Eyes:     General:        Right eye: No discharge.        Left eye: No discharge.     Conjunctiva/sclera: Conjunctivae normal.     Pupils: Pupils are equal, round, and reactive to light.  Neck:     Thyroid: No thyromegaly.     Vascular: No JVD.     Trachea: No tracheal deviation.  Cardiovascular:     Rate and Rhythm: Normal rate and regular rhythm.     Heart sounds: Normal heart sounds.  Pulmonary:     Effort: No respiratory distress.     Breath sounds: No stridor. No wheezing.  Abdominal:     General: Bowel sounds are normal. There is no distension.     Palpations: Abdomen is soft. There is no mass.     Tenderness: There is no abdominal tenderness. There is no guarding or rebound.  Musculoskeletal:        General: No tenderness.     Cervical back: Normal range of motion and neck supple.  Lymphadenopathy:     Cervical: No cervical adenopathy.  Skin:    Findings: No erythema or rash.  Neurological:     Cranial Nerves: No cranial nerve deficit.     Motor: No abnormal muscle tone.     Coordination: Coordination normal.     Deep Tendon Reflexes: Reflexes normal.  Psychiatric:        Behavior:  Behavior normal.        Thought Content: Thought content normal.        Judgment: Judgment normal.   bruises on arms, legs  Lab Results  Component Value Date   WBC 9.3 03/15/2019   HGB 13.7 03/15/2019   HCT 40.8 03/15/2019   PLT 392.0 03/15/2019   GLUCOSE 91 03/15/2019   CHOL 223 (H) 03/15/2019   TRIG 172.0 (H) 03/15/2019   HDL 65.60 03/15/2019   LDLDIRECT 106.2 09/16/2012   LDLCALC 123 (H) 03/15/2019   ALT 18 03/15/2019   AST 19 03/15/2019   NA 138 03/15/2019   K 4.4 03/15/2019   CL 101 03/15/2019   CREATININE 0.79 03/15/2019   BUN 16 03/15/2019   CO2 28 03/15/2019   TSH 1.73 03/15/2019   HGBA1C 5.8 03/15/2019    CT CARDIAC SCORING  Addendum Date: 05/14/2019   ADDENDUM REPORT: 05/14/2019 14:14 EXAM: OVER-READ INTERPRETATION  CT CHEST The following report is an over-read performed by radiologist Dr. Samara Snide [facility] on 05/14/2019. This over-read does not include interpretation of cardiac or coronary anatomy or pathology. The coronary calcium score interpretation by the cardiologist is attached. COMPARISON:  None. FINDINGS: Cardiovascular: Normal heart size. No significant pericardial effusion/thickening. Great vessels are normal in course and caliber. Mediastinum/Nodes: Unremarkable esophagus. No pathologically enlarged mediastinal or hilar lymph nodes, noting limited sensitivity for the detection of hilar adenopathy on this noncontrast study. Lungs/Pleura: No pneumothorax. No pleural effusion. No acute consolidative airspace disease or lung masses. Solid posterior right lower lobe 6 mm pulmonary nodule (series 4/image 15), unchanged since 05/19/2015 CT abdomen study. Tiny calcified medial basilar right lower lobe granuloma. No additional significant pulmonary nodules. Upper abdomen: No acute abnormality. Musculoskeletal: No aggressive appearing focal osseous lesions. Mild thoracic spondylosis. IMPRESSION: No significant extracardiac findings. Solid right lower lobe  6 mm pulmonary nodule is stable since 2016 CT abdomen study, considered benign. Electronically Signed   By: Ilona Sorrel M.D.   On: 05/14/2019 14:14   Result Date: 05/14/2019 CLINICAL DATA:  Risk stratification EXAM: Coronary Calcium Score TECHNIQUE: The patient was scanned on a [employer]. Axial non-contrast 3 mm slices were carried out through the heart. The data set was analyzed on a dedicated work station and scored using the [employer]. FINDINGS: Non-cardiac: See separate report from [facility]. Ascending Aorta: Normal caliber. Pericardium: Normal. Coronary arteries: Normal origins. IMPRESSION: Coronary calcium score of 1. This was 43rd percentile for age and sex matched control. Eleonore Chiquito, MD Electronically Signed: By: Eleonore Chiquito On: 05/14/2019 13:25    Assessment &  Plan:   There are no diagnoses linked to this encounter.   Meds ordered this encounter  Medications  . gabapentin (NEURONTIN) 300 MG capsule    Sig: Take 1 capsule (300 mg total) by mouth at bedtime.    Dispense:  90 capsule    Refill:  3  . zolpidem (AMBIEN) 10 MG tablet    Sig: Take 1 tablet (10 mg total) by mouth at bedtime as needed for sleep.    Dispense:  90 tablet    Refill:  1     Follow-up: No follow-ups on file.  Walker Kehr, MD

## 2020-04-10 NOTE — Addendum Note (Signed)
Addended by: Steward Ros on: 04/10/2020 02:11 PM   Modules accepted: Orders

## 2020-04-10 NOTE — Assessment & Plan Note (Addendum)
R CP at the tip of the R shoulder blade x 1 week Lidoderm CXR

## 2020-04-11 LAB — URINALYSIS
Bilirubin Urine: NEGATIVE
Glucose, UA: NEGATIVE
Hgb urine dipstick: NEGATIVE
Leukocytes,Ua: NEGATIVE
Nitrite: NEGATIVE
Protein, ur: NEGATIVE
Specific Gravity, Urine: 1.026 (ref 1.001–1.03)
pH: <=5 (ref 5.0–8.0)

## 2020-04-11 LAB — CBC WITH DIFFERENTIAL/PLATELET
Absolute Monocytes: 766 cells/uL (ref 200–950)
Basophils Absolute: 49 cells/uL (ref 0–200)
Basophils Relative: 0.5 %
Eosinophils Absolute: 146 cells/uL (ref 15–500)
Eosinophils Relative: 1.5 %
HCT: 43.2 % (ref 35.0–45.0)
Hemoglobin: 14 g/dL (ref 11.7–15.5)
Lymphs Abs: 2406 cells/uL (ref 850–3900)
MCH: 31.3 pg (ref 27.0–33.0)
MCHC: 32.4 g/dL (ref 32.0–36.0)
MCV: 96.4 fL (ref 80.0–100.0)
MPV: 10.1 fL (ref 7.5–12.5)
Monocytes Relative: 7.9 %
Neutro Abs: 6334 cells/uL (ref 1500–7800)
Neutrophils Relative %: 65.3 %
Platelets: 371 10*3/uL (ref 140–400)
RBC: 4.48 10*6/uL (ref 3.80–5.10)
RDW: 12.2 % (ref 11.0–15.0)
Total Lymphocyte: 24.8 %
WBC: 9.7 10*3/uL (ref 3.8–10.8)

## 2020-04-11 LAB — LIPID PANEL
Cholesterol: 230 mg/dL — ABNORMAL HIGH (ref <200)
HDL: 81 mg/dL (ref >=50)
LDL Cholesterol (Calc): 124 mg/dL (calc) — ABNORMAL HIGH
Non-HDL Cholesterol (Calc): 149 mg/dL (calc) — ABNORMAL HIGH (ref <130)
Total CHOL/HDL Ratio: 2.8 (calc) (ref <5.0)
Triglycerides: 132 mg/dL (ref <150)

## 2020-04-11 LAB — PROTIME-INR
INR: 0.9
Prothrombin Time: 9.9 s (ref 9.0–11.5)

## 2020-04-11 LAB — TSH: TSH: 0.9 mIU/L (ref 0.40–4.50)

## 2020-04-12 ENCOUNTER — Telehealth: Payer: Self-pay

## 2020-04-12 NOTE — Telephone Encounter (Signed)
PA has been approved

## 2020-04-12 NOTE — Telephone Encounter (Signed)
Key: [id number]  DX: Shoulder pain per LOV.

## 2020-04-14 ENCOUNTER — Other Ambulatory Visit: Payer: Self-pay | Admitting: Internal Medicine

## 2020-04-14 MED ORDER — SULFAMETHOXAZOLE-TRIMETHOPRIM 800-160 MG PO TABS
1.0000 | ORAL_TABLET | Freq: Two times a day (BID) | ORAL | 0 refills | Status: AC
Start: 2020-04-14 — End: ?

## 2020-04-21 DIAGNOSIS — R109 Unspecified abdominal pain: Secondary | ICD-10-CM | POA: Diagnosis not present

## 2020-04-21 DIAGNOSIS — N23 Unspecified renal colic: Secondary | ICD-10-CM | POA: Diagnosis not present

## 2020-05-21 ENCOUNTER — Other Ambulatory Visit: Payer: Self-pay | Admitting: Internal Medicine

## 2020-05-21 MED ORDER — METHYLPREDNISOLONE 4 MG PO TBPK: ORAL_TABLET | ORAL | 0 refills | Status: AC

## 2020-05-21 NOTE — Progress Notes (Unsigned)
Medrol pack 

## 2020-05-26 DIAGNOSIS — M5136 Other intervertebral disc degeneration, lumbar region: Secondary | ICD-10-CM | POA: Diagnosis not present

## 2020-05-26 DIAGNOSIS — M5116 Intervertebral disc disorders with radiculopathy, lumbar region: Secondary | ICD-10-CM | POA: Diagnosis not present

## 2020-05-26 DIAGNOSIS — M4726 Other spondylosis with radiculopathy, lumbar region: Secondary | ICD-10-CM | POA: Diagnosis not present

## 2020-06-01 DIAGNOSIS — M4316 Spondylolisthesis, lumbar region: Secondary | ICD-10-CM | POA: Diagnosis not present

## 2020-06-01 DIAGNOSIS — M545 Low back pain: Secondary | ICD-10-CM | POA: Diagnosis not present

## 2020-06-05 DIAGNOSIS — M47816 Spondylosis without myelopathy or radiculopathy, lumbar region: Secondary | ICD-10-CM | POA: Diagnosis not present

## 2020-06-05 DIAGNOSIS — Z23 Encounter for immunization: Secondary | ICD-10-CM | POA: Diagnosis not present

## 2020-06-05 DIAGNOSIS — M545 Low back pain: Secondary | ICD-10-CM | POA: Diagnosis not present

## 2020-06-05 DIAGNOSIS — M5126 Other intervertebral disc displacement, lumbar region: Secondary | ICD-10-CM | POA: Diagnosis not present

## 2020-06-06 MED ORDER — OMEPRAZOLE 40 MG PO CPDR: 40.0000 mg | DELAYED_RELEASE_CAPSULE | Freq: Every day | ORAL | 0 refills | Status: DC

## 2020-06-06 MED ORDER — GABAPENTIN 300 MG PO CAPS: 300.0000 mg | ORAL_CAPSULE | Freq: Every day | ORAL | 3 refills | Status: AC

## 2020-06-08 DIAGNOSIS — M545 Low back pain: Secondary | ICD-10-CM | POA: Diagnosis not present

## 2020-06-08 DIAGNOSIS — M47816 Spondylosis without myelopathy or radiculopathy, lumbar region: Secondary | ICD-10-CM | POA: Diagnosis not present

## 2020-06-12 DIAGNOSIS — M4696 Unspecified inflammatory spondylopathy, lumbar region: Secondary | ICD-10-CM | POA: Diagnosis not present

## 2020-06-12 DIAGNOSIS — M4316 Spondylolisthesis, lumbar region: Secondary | ICD-10-CM | POA: Diagnosis not present

## 2020-06-12 DIAGNOSIS — M47816 Spondylosis without myelopathy or radiculopathy, lumbar region: Secondary | ICD-10-CM | POA: Diagnosis not present

## 2020-06-14 IMAGING — DX DG CHEST 2V
2 series · 2 of 2 positions shown · non-contrast
Comparison: None.

CLINICAL DATA: Right chest/posterior lower rib pain.

EXAM:
CHEST - 2 VIEW

[chest pa]
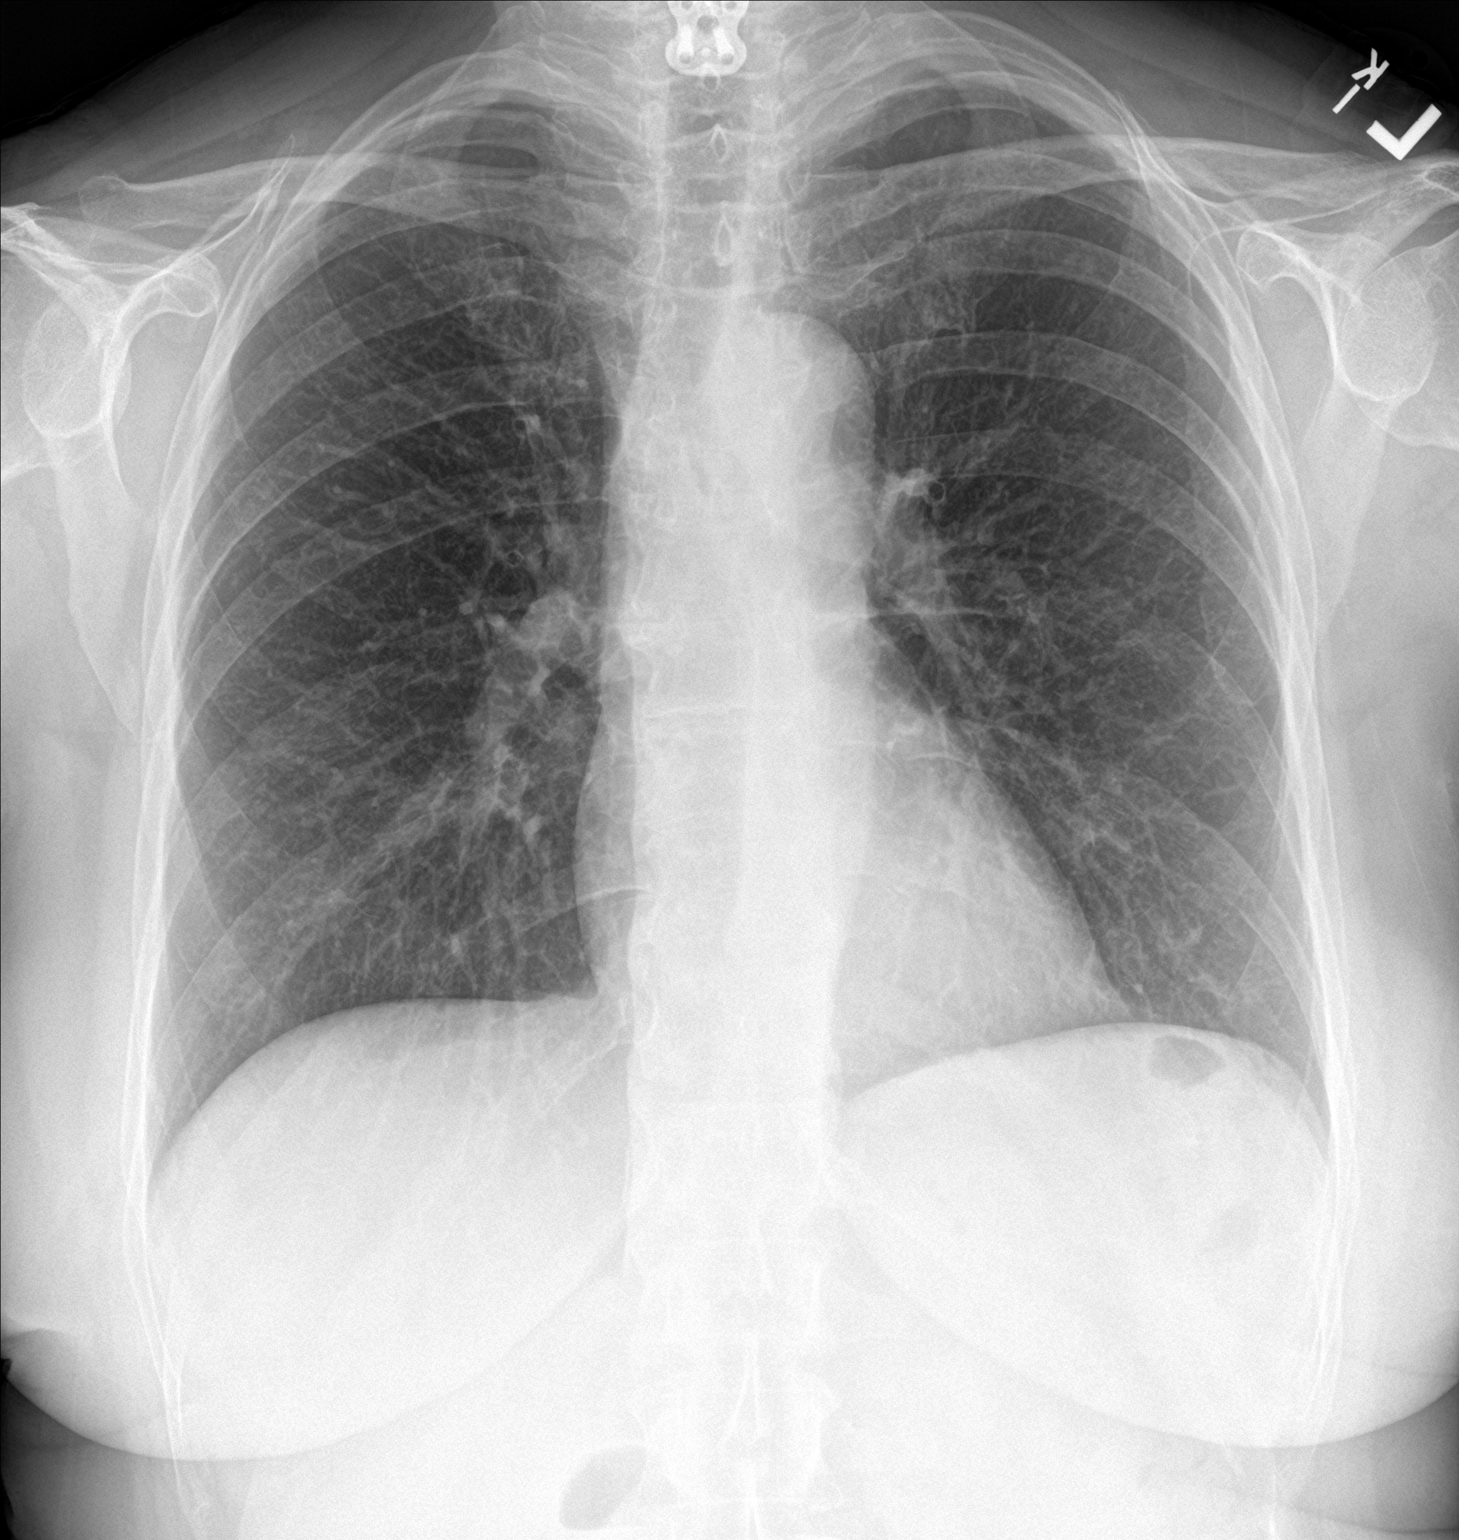

[chest lat]
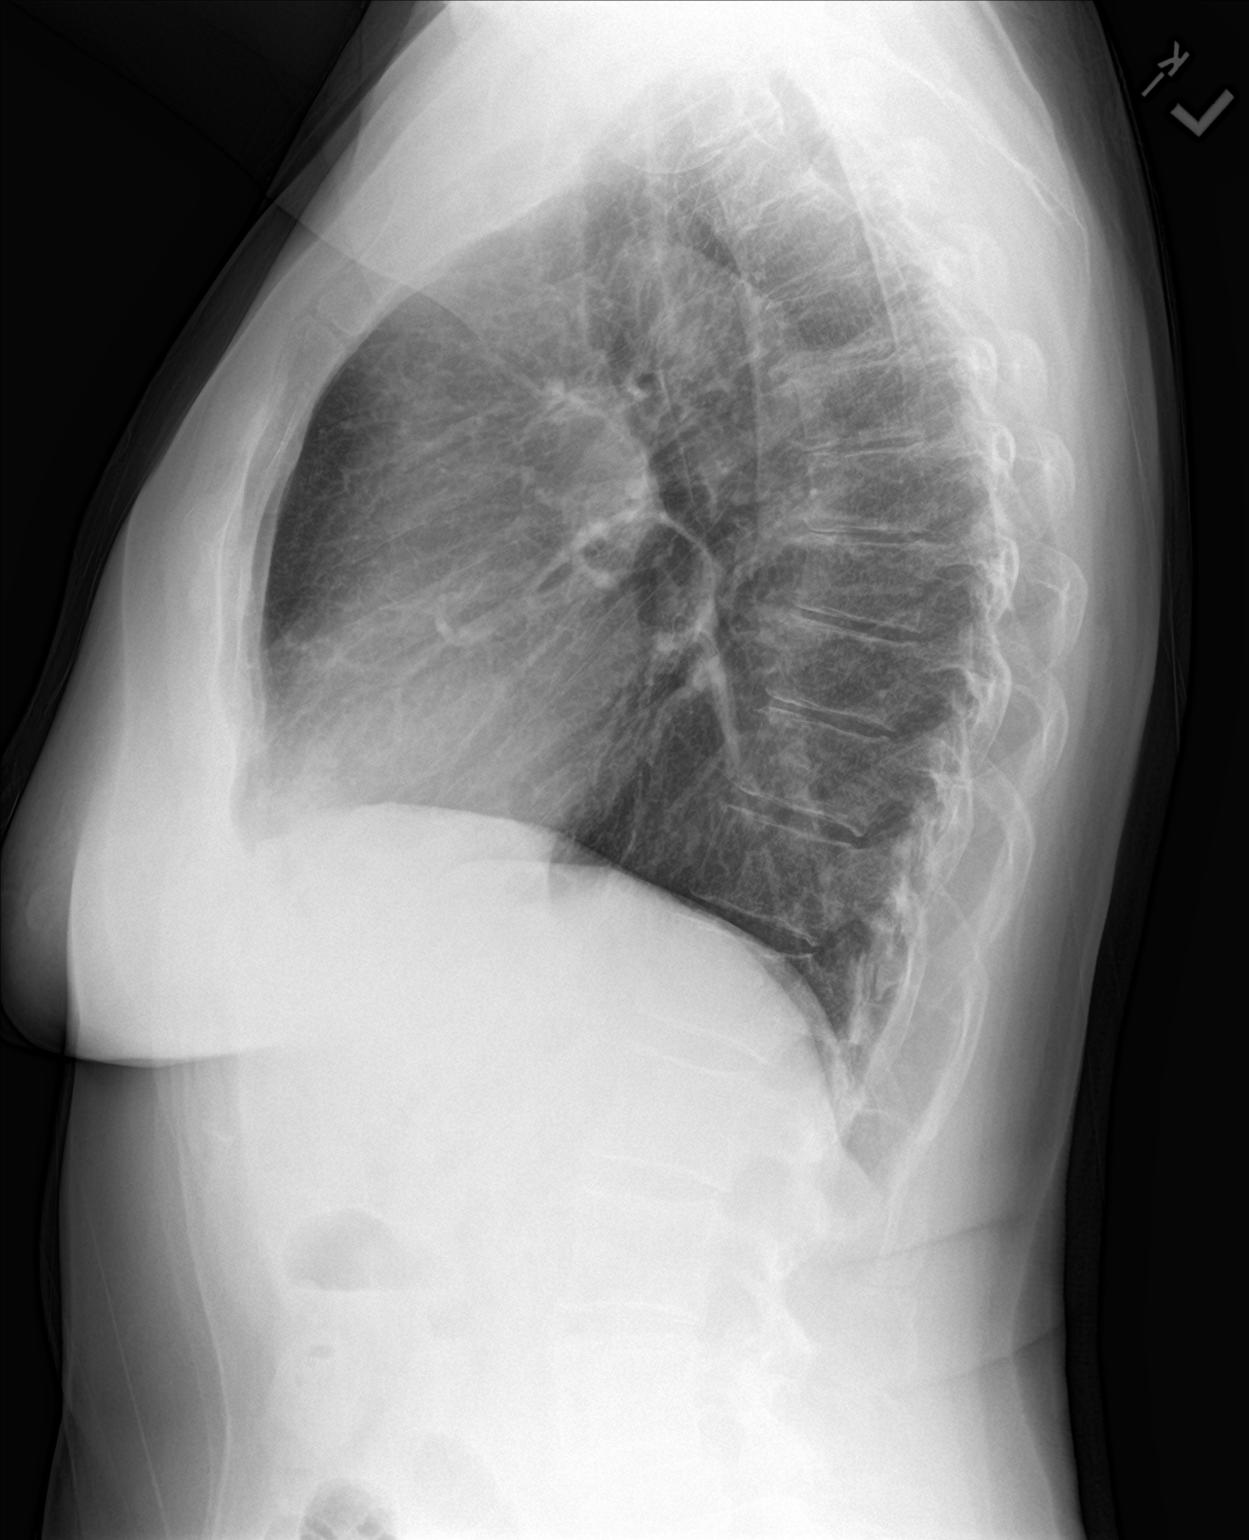

[2 of 2 positions shown; findings below may reference images not displayed]

FINDINGS: The cardiomediastinal silhouette is within normal limits. The lungs
are well inflated with mild biapical pleural thickening and minimal
left basilar scarring noted. No focal airspace consolidation, edema,
pleural effusion, or pneumothorax is identified. No acute osseous
abnormality is identified, with detailed rib assessment deferred to
separate radiographs.
IMPRESSION: No active cardiopulmonary disease.

## 2020-06-14 IMAGING — DX DG RIBS 2V*R*
2 series · 2 of 2 positions shown · non-contrast
Comparison: None.

CLINICAL DATA: Right chest/posterior lower rib pain.

EXAM:
RIGHT RIBS - 2 VIEW

[rib pa]
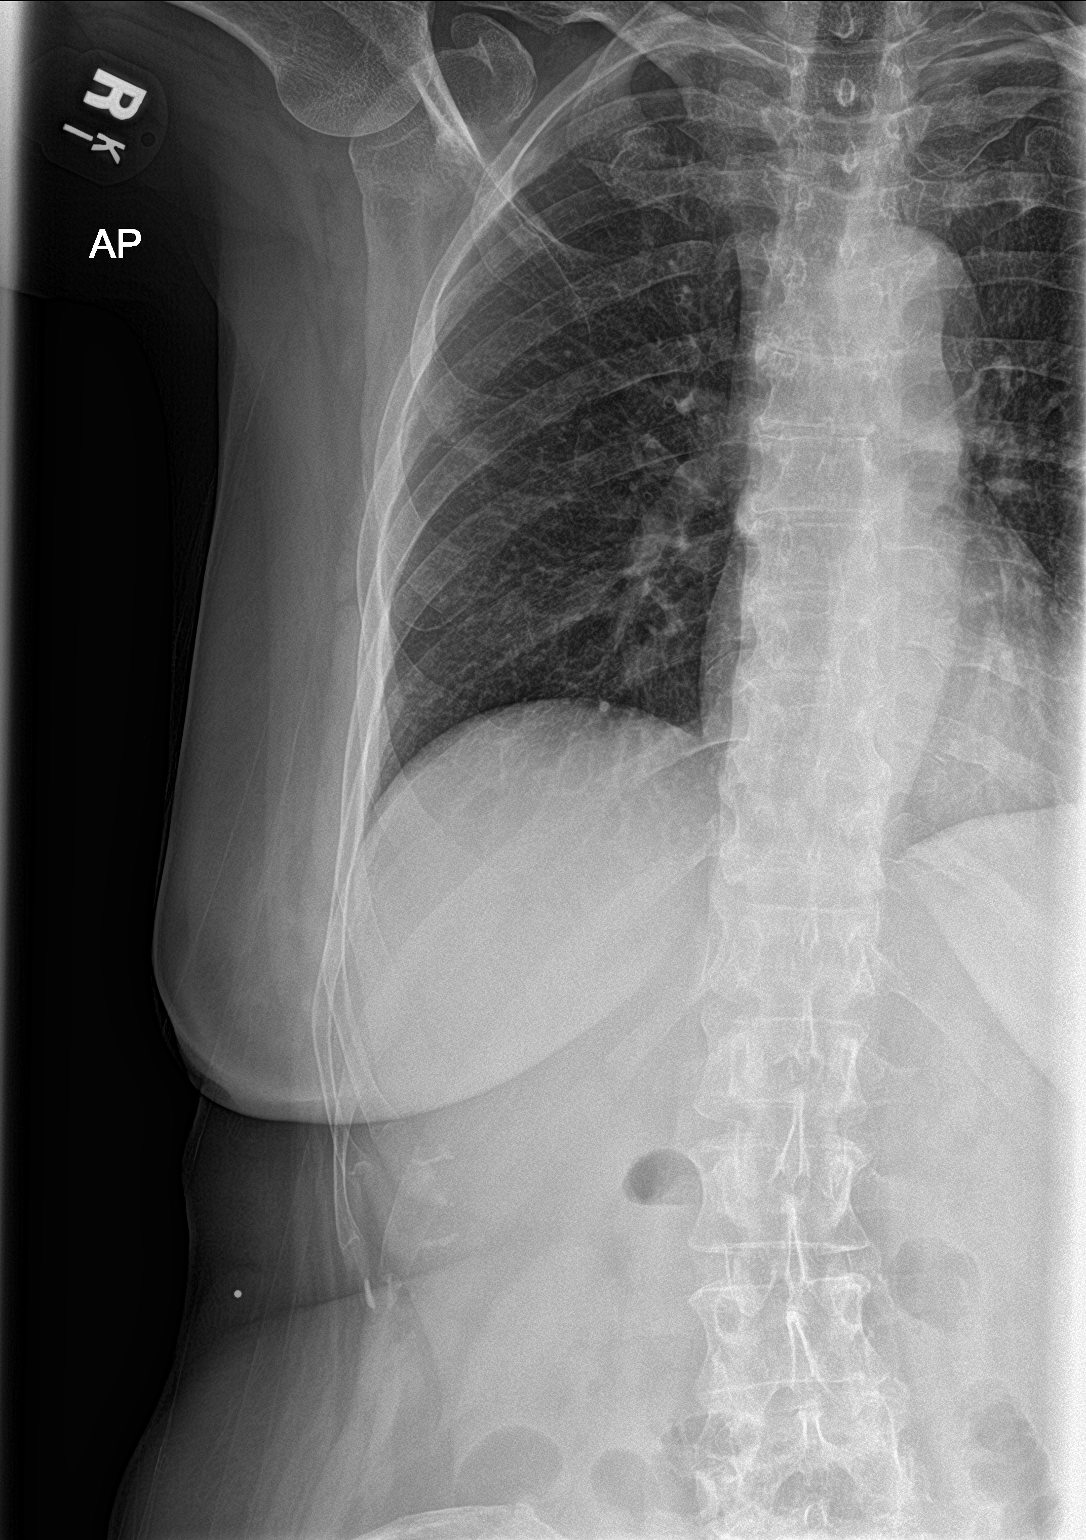

[rib obl]
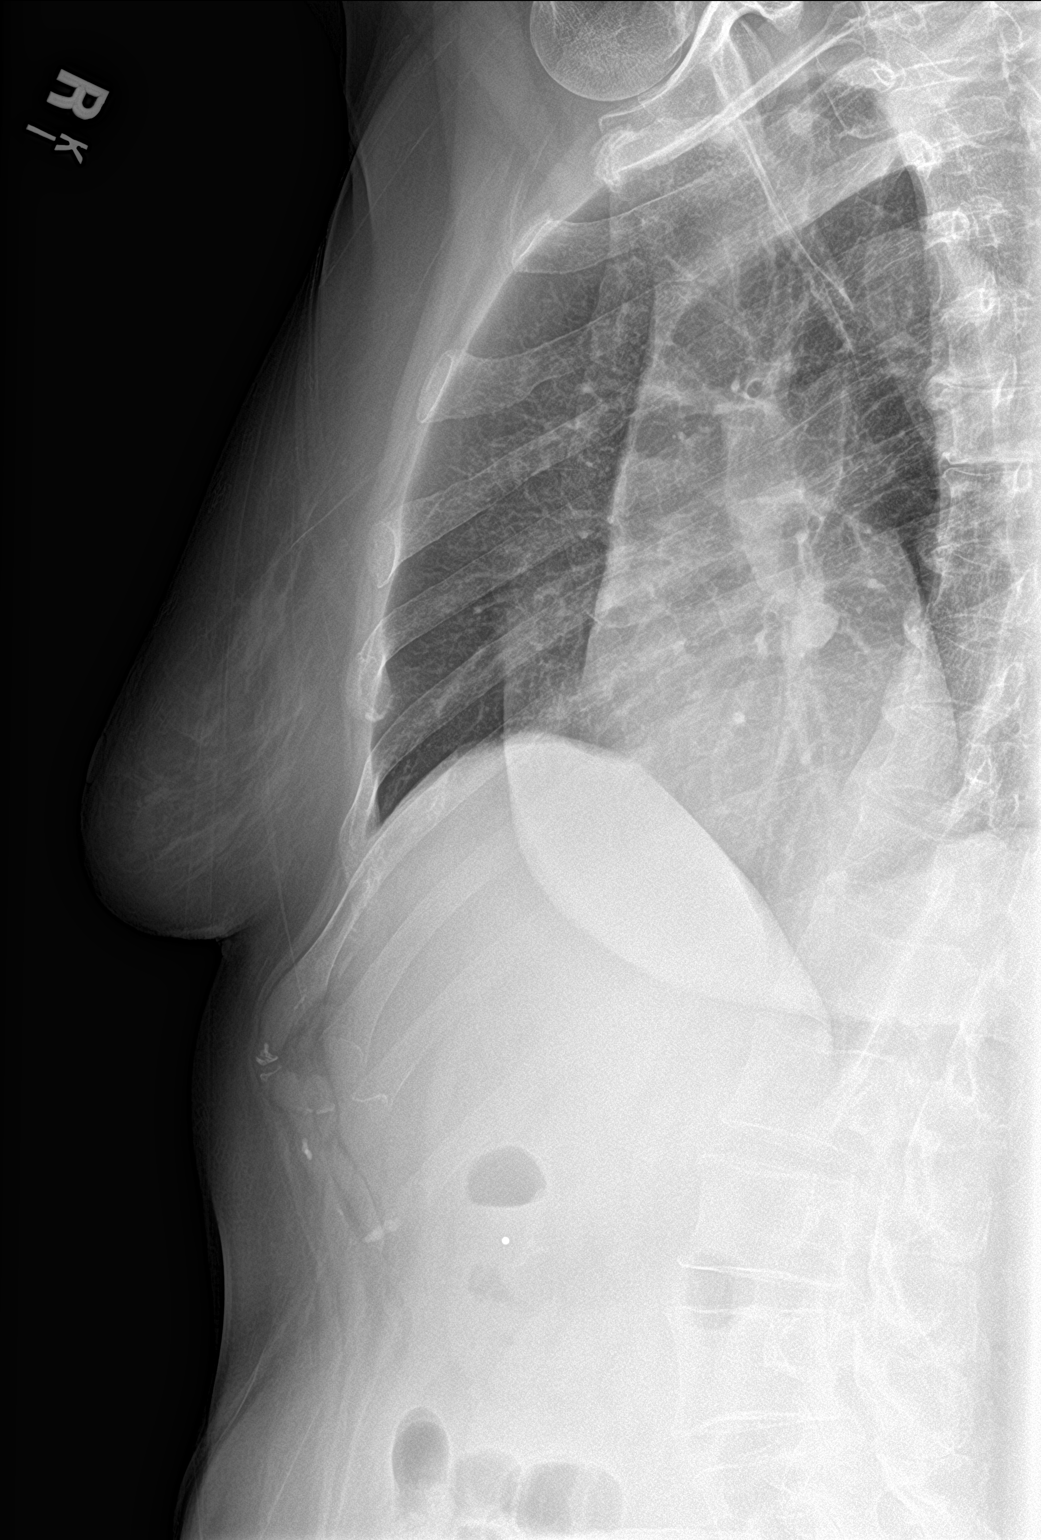

[2 of 2 positions shown; findings below may reference images not displayed]

FINDINGS: No fracture or other bone lesions are seen involving the right ribs.
IMPRESSION: Negative.

## 2020-08-11 DIAGNOSIS — Z885 Allergy status to narcotic agent status: Secondary | ICD-10-CM | POA: Diagnosis not present

## 2020-08-11 DIAGNOSIS — M25471 Effusion, right ankle: Secondary | ICD-10-CM | POA: Diagnosis not present

## 2020-08-11 DIAGNOSIS — M7989 Other specified soft tissue disorders: Secondary | ICD-10-CM | POA: Diagnosis not present

## 2020-08-11 DIAGNOSIS — R2241 Localized swelling, mass and lump, right lower limb: Secondary | ICD-10-CM | POA: Diagnosis not present

## 2020-08-11 DIAGNOSIS — Z86718 Personal history of other venous thrombosis and embolism: Secondary | ICD-10-CM | POA: Diagnosis not present

## 2020-08-11 DIAGNOSIS — R600 Localized edema: Secondary | ICD-10-CM | POA: Diagnosis not present

## 2020-08-11 DIAGNOSIS — M7731 Calcaneal spur, right foot: Secondary | ICD-10-CM | POA: Diagnosis not present

## 2020-08-21 DIAGNOSIS — M25471 Effusion, right ankle: Secondary | ICD-10-CM | POA: Diagnosis not present

## 2020-08-21 DIAGNOSIS — M1712 Unilateral primary osteoarthritis, left knee: Secondary | ICD-10-CM | POA: Diagnosis not present

## 2020-08-21 DIAGNOSIS — M1711 Unilateral primary osteoarthritis, right knee: Secondary | ICD-10-CM | POA: Diagnosis not present

## 2020-09-10 ENCOUNTER — Other Ambulatory Visit: Payer: Self-pay | Admitting: Internal Medicine

## 2021-02-26 ENCOUNTER — Other Ambulatory Visit: Payer: Self-pay | Admitting: Internal Medicine

## 2021-02-26 MED ORDER — METRONIDAZOLE 500 MG PO TABS
500.0000 mg | ORAL_TABLET | Freq: Three times a day (TID) | ORAL | 0 refills | Status: DC
Start: 2021-02-26 — End: 2021-02-26

## 2021-02-26 MED ORDER — CIPROFLOXACIN HCL 500 MG PO TABS: 500.0000 mg | ORAL_TABLET | Freq: Two times a day (BID) | ORAL | 0 refills | Status: DC

## 2021-02-26 MED ORDER — METRONIDAZOLE 500 MG PO TABS: 500.0000 mg | ORAL_TABLET | Freq: Three times a day (TID) | ORAL | 0 refills | Status: AC

## 2021-02-26 MED ORDER — CIPROFLOXACIN HCL 500 MG PO TABS: 500.0000 mg | ORAL_TABLET | Freq: Two times a day (BID) | ORAL | 0 refills | Status: AC

## 2021-02-26 NOTE — Progress Notes (Signed)
Diverticulitis flare

## 2021-04-16 ENCOUNTER — Ambulatory Visit: Payer: Medicare Other | Admitting: Internal Medicine

## 2021-04-17 ENCOUNTER — Ambulatory Visit: Payer: Medicare Other

## 2021-04-17 ENCOUNTER — Ambulatory Visit: Payer: Medicare Other | Admitting: Internal Medicine

## 2023-12-18 ENCOUNTER — Telehealth: Payer: Self-pay | Admitting: Internal Medicine

## 2023-12-18 NOTE — Telephone Encounter (Signed)
 Contacted Sheila Hall to schedule their annual wellness visit. Patient declined to schedule AWV at this time. Patient has relocated, and now living in [place].  [facility] Care Guide [facility] AWV TEAM Direct Dial: [phone]
